# Patient Record
Sex: Female | Born: 1955
Health system: Southern US, Community
[De-identification: ages and names within clinical notes are randomized; demographics above are authoritative.]

## PROBLEM LIST (undated history)

## (undated) DIAGNOSIS — F32A Depression, unspecified: Secondary | ICD-10-CM

## (undated) DIAGNOSIS — F329 Major depressive disorder, single episode, unspecified: Secondary | ICD-10-CM

## (undated) DIAGNOSIS — M1990 Unspecified osteoarthritis, unspecified site: Secondary | ICD-10-CM

## (undated) DIAGNOSIS — I10 Essential (primary) hypertension: Secondary | ICD-10-CM

## (undated) DIAGNOSIS — F419 Anxiety disorder, unspecified: Secondary | ICD-10-CM

## (undated) DIAGNOSIS — L309 Dermatitis, unspecified: Secondary | ICD-10-CM

## (undated) HISTORY — PX: KNEE SURGERY: SHX244

## (undated) HISTORY — PX: FINGER GANGLION CYST EXCISION: SHX1636

## (undated) HISTORY — DX: Essential (primary) hypertension: I10

## (undated) HISTORY — DX: Major depressive disorder, single episode, unspecified: F32.9

## (undated) HISTORY — DX: Depression, unspecified: F32.A

## (undated) HISTORY — DX: Anxiety disorder, unspecified: F41.9

---

## 2004-10-15 ENCOUNTER — Emergency Department (HOSPITAL_COMMUNITY): Admission: EM | Admit: 2004-10-15 | Discharge: 2004-10-15 | Payer: Self-pay | Admitting: Emergency Medicine

## 2007-01-08 ENCOUNTER — Ambulatory Visit (HOSPITAL_COMMUNITY): Admission: RE | Admit: 2007-01-08 | Discharge: 2007-01-08 | Payer: Self-pay | Admitting: Family Medicine

## 2007-03-05 ENCOUNTER — Encounter: Admission: RE | Admit: 2007-03-05 | Discharge: 2007-03-05 | Payer: Self-pay | Admitting: Obstetrics and Gynecology

## 2007-05-19 ENCOUNTER — Other Ambulatory Visit: Admission: RE | Admit: 2007-05-19 | Discharge: 2007-05-19 | Payer: Self-pay | Admitting: Obstetrics and Gynecology

## 2007-07-09 ENCOUNTER — Ambulatory Visit (HOSPITAL_COMMUNITY): Admission: RE | Admit: 2007-07-09 | Discharge: 2007-07-09 | Payer: Self-pay | Admitting: Family Medicine

## 2007-07-13 ENCOUNTER — Ambulatory Visit (HOSPITAL_COMMUNITY): Admission: RE | Admit: 2007-07-13 | Discharge: 2007-07-13 | Payer: Self-pay | Admitting: Family Medicine

## 2008-03-09 ENCOUNTER — Encounter: Admission: RE | Admit: 2008-03-09 | Discharge: 2008-03-09 | Payer: Self-pay | Admitting: Obstetrics and Gynecology

## 2008-03-17 ENCOUNTER — Encounter: Admission: RE | Admit: 2008-03-17 | Discharge: 2008-03-17 | Payer: Self-pay | Admitting: Obstetrics and Gynecology

## 2008-06-12 ENCOUNTER — Other Ambulatory Visit: Admission: RE | Admit: 2008-06-12 | Discharge: 2008-06-12 | Payer: Self-pay | Admitting: Obstetrics and Gynecology

## 2009-03-12 ENCOUNTER — Ambulatory Visit (HOSPITAL_COMMUNITY): Admission: RE | Admit: 2009-03-12 | Discharge: 2009-03-12 | Payer: Self-pay | Admitting: Obstetrics and Gynecology

## 2010-03-15 ENCOUNTER — Ambulatory Visit (HOSPITAL_COMMUNITY): Admission: RE | Admit: 2010-03-15 | Discharge: 2010-03-15 | Payer: Self-pay | Admitting: Obstetrics and Gynecology

## 2010-06-23 ENCOUNTER — Encounter (INDEPENDENT_AMBULATORY_CARE_PROVIDER_SITE_OTHER): Payer: Self-pay | Admitting: Internal Medicine

## 2011-03-13 ENCOUNTER — Other Ambulatory Visit (HOSPITAL_COMMUNITY): Payer: Self-pay | Admitting: Family Medicine

## 2011-03-13 DIAGNOSIS — Z139 Encounter for screening, unspecified: Secondary | ICD-10-CM

## 2011-03-25 ENCOUNTER — Ambulatory Visit (HOSPITAL_COMMUNITY)
Admission: RE | Admit: 2011-03-25 | Discharge: 2011-03-25 | Disposition: A | Discharge status: Home / Self Care | Payer: BC Managed Care – PPO | Source: Ambulatory Visit | Attending: Family Medicine | Admitting: Family Medicine

## 2011-03-25 DIAGNOSIS — Z1231 Encounter for screening mammogram for malignant neoplasm of breast: Secondary | ICD-10-CM | POA: Insufficient documentation

## 2011-03-25 DIAGNOSIS — Z139 Encounter for screening, unspecified: Secondary | ICD-10-CM

## 2011-06-25 ENCOUNTER — Other Ambulatory Visit (HOSPITAL_COMMUNITY): Payer: Self-pay | Admitting: Family Medicine

## 2011-06-25 ENCOUNTER — Ambulatory Visit (HOSPITAL_COMMUNITY)
Admission: RE | Admit: 2011-06-25 | Discharge: 2011-06-25 | Disposition: A | Discharge status: Home / Self Care | Payer: BC Managed Care – PPO | Source: Ambulatory Visit | Attending: Family Medicine | Admitting: Family Medicine

## 2011-06-25 DIAGNOSIS — M19019 Primary osteoarthritis, unspecified shoulder: Secondary | ICD-10-CM | POA: Insufficient documentation

## 2011-06-25 DIAGNOSIS — M1990 Unspecified osteoarthritis, unspecified site: Secondary | ICD-10-CM

## 2011-06-25 DIAGNOSIS — M25519 Pain in unspecified shoulder: Secondary | ICD-10-CM | POA: Insufficient documentation

## 2012-03-17 ENCOUNTER — Other Ambulatory Visit (HOSPITAL_COMMUNITY): Payer: Self-pay | Admitting: Family Medicine

## 2012-03-17 DIAGNOSIS — Z139 Encounter for screening, unspecified: Secondary | ICD-10-CM

## 2012-03-26 ENCOUNTER — Ambulatory Visit (HOSPITAL_COMMUNITY)
Admission: RE | Admit: 2012-03-26 | Discharge: 2012-03-26 | Disposition: A | Discharge status: Home / Self Care | Payer: BC Managed Care – PPO | Source: Ambulatory Visit | Attending: Family Medicine | Admitting: Family Medicine

## 2012-03-26 DIAGNOSIS — Z1231 Encounter for screening mammogram for malignant neoplasm of breast: Secondary | ICD-10-CM | POA: Insufficient documentation

## 2012-03-26 DIAGNOSIS — Z139 Encounter for screening, unspecified: Secondary | ICD-10-CM

## 2012-08-30 ENCOUNTER — Encounter (INDEPENDENT_AMBULATORY_CARE_PROVIDER_SITE_OTHER): Payer: Self-pay | Admitting: *Deleted

## 2012-08-30 ENCOUNTER — Encounter (INDEPENDENT_AMBULATORY_CARE_PROVIDER_SITE_OTHER): Payer: Self-pay

## 2012-09-02 ENCOUNTER — Other Ambulatory Visit (INDEPENDENT_AMBULATORY_CARE_PROVIDER_SITE_OTHER): Payer: Self-pay | Admitting: *Deleted

## 2012-09-02 ENCOUNTER — Telehealth (INDEPENDENT_AMBULATORY_CARE_PROVIDER_SITE_OTHER): Payer: Self-pay | Admitting: *Deleted

## 2012-09-02 DIAGNOSIS — Z800 Family history of malignant neoplasm of digestive organs: Secondary | ICD-10-CM

## 2012-09-02 DIAGNOSIS — Z86010 Personal history of colonic polyps: Secondary | ICD-10-CM

## 2012-09-02 DIAGNOSIS — Z1211 Encounter for screening for malignant neoplasm of colon: Secondary | ICD-10-CM

## 2012-09-02 NOTE — Telephone Encounter (Signed)
Patient needs movi prep 

## 2012-09-03 ENCOUNTER — Encounter (HOSPITAL_COMMUNITY): Payer: Self-pay | Admitting: Pharmacy Technician

## 2012-09-06 MED ORDER — PEG-KCL-NACL-NASULF-NA ASC-C 100 G PO SOLR: 1.0000 | Freq: Once | ORAL | Status: DC

## 2012-09-21 ENCOUNTER — Telehealth (INDEPENDENT_AMBULATORY_CARE_PROVIDER_SITE_OTHER): Payer: Self-pay | Admitting: *Deleted

## 2012-09-21 NOTE — Telephone Encounter (Signed)
  Procedure: tcs  Reason/Indication:  Hx polyps, fa, hx colon ca  Has patient had this procedure before?  Yes, 4/09 (scanned)  If so, when, by whom and where?    Is there a family history of colon cancer?  Yes, grandmother  Who?  What age when diagnosed?    Is patient diabetic?   no      Does patient have prosthetic heart valve?  no  Do you have a pacemaker?  no  Has patient ever had endocarditis? no  Has patient had joint replacement within last 12 months?  no  Is patient on Coumadin, Plavix and/or Aspirin? no  Medications: naproxen 500 mg 1 tab bid  Allergies: nkda  Medication Adjustment:   Procedure date & time: 10/20/12 at 930

## 2012-09-21 NOTE — Telephone Encounter (Signed)
agree

## 2012-10-20 ENCOUNTER — Encounter (HOSPITAL_COMMUNITY): Payer: Self-pay | Admitting: *Deleted

## 2012-10-20 ENCOUNTER — Encounter (HOSPITAL_COMMUNITY)
Admission: RE | Disposition: A | Discharge status: Home / Self Care | Payer: Self-pay | Source: Ambulatory Visit | Attending: Internal Medicine

## 2012-10-20 ENCOUNTER — Ambulatory Visit (HOSPITAL_COMMUNITY)
Admission: RE | Admit: 2012-10-20 | Discharge: 2012-10-20 | Disposition: A | Discharge status: Home / Self Care | Payer: BC Managed Care – PPO | Source: Ambulatory Visit | Attending: Internal Medicine | Admitting: Internal Medicine

## 2012-10-20 DIAGNOSIS — Z800 Family history of malignant neoplasm of digestive organs: Secondary | ICD-10-CM

## 2012-10-20 DIAGNOSIS — Z860100 Personal history of colon polyps, unspecified: Secondary | ICD-10-CM | POA: Insufficient documentation

## 2012-10-20 DIAGNOSIS — K5730 Diverticulosis of large intestine without perforation or abscess without bleeding: Secondary | ICD-10-CM | POA: Insufficient documentation

## 2012-10-20 DIAGNOSIS — K6389 Other specified diseases of intestine: Secondary | ICD-10-CM

## 2012-10-20 DIAGNOSIS — K5900 Constipation, unspecified: Secondary | ICD-10-CM

## 2012-10-20 HISTORY — DX: Unspecified osteoarthritis, unspecified site: M19.90

## 2012-10-20 HISTORY — DX: Dermatitis, unspecified: L30.9

## 2012-10-20 HISTORY — PX: COLONOSCOPY: SHX5424

## 2012-10-20 SURGERY — COLONOSCOPY
Anesthesia: Moderate Sedation

## 2012-10-20 MED ORDER — MIDAZOLAM HCL 5 MG/5ML IJ SOLN
INTRAMUSCULAR | Status: DC | PRN
  Administered 2012-10-20: 1 mg via INTRAVENOUS
  Administered 2012-10-20 (×3): 2 mg via INTRAVENOUS

## 2012-10-20 MED ORDER — SODIUM CHLORIDE 0.9 % IV SOLN
INTRAVENOUS | Status: DC
  Administered 2012-10-20: 20 mL/h via INTRAVENOUS

## 2012-10-20 MED ORDER — MIDAZOLAM HCL 5 MG/5ML IJ SOLN
INTRAMUSCULAR | Status: AC
  Filled 2012-10-20: qty 10

## 2012-10-20 MED ORDER — MEPERIDINE HCL 50 MG/ML IJ SOLN
INTRAMUSCULAR | Status: AC
  Filled 2012-10-20: qty 1

## 2012-10-20 MED ORDER — MEPERIDINE HCL 50 MG/ML IJ SOLN
INTRAMUSCULAR | Status: DC | PRN
  Administered 2012-10-20 (×2): 25 mg via INTRAVENOUS

## 2012-10-20 MED ORDER — STERILE WATER FOR IRRIGATION IR SOLN
Status: DC | PRN
  Administered 2012-10-20: 09:00:00

## 2012-10-20 NOTE — H&P (Signed)
Megan Schaefer is an 57 y.o. female.   Chief Complaint: Patient's here for colonoscopy. HPI: Patient is 57 year old female who is for surveillance colonoscopy. Her last exam was 5 years ago with removal of 2 small polyps and one was tubular adenoma. Family history significant for colon carcinoma in paternal grandfather and his 64s and mother also colon carcinoma and died at 59. She denies abdominal pain change in her bowel habits or rectal bleeding. Past Medical History  Diagnosis Date  . Eczema   . Arthritis     Past Surgical History  Procedure Laterality Date  . Finger ganglion cyst excision Right     pinky    History reviewed. No pertinent family history. Social History:  reports that she has never smoked. She does not have any smokeless tobacco history on file. She reports that she drinks about 0.6 ounces of alcohol per week. She reports that she does not use illicit drugs.  Allergies: No Known Allergies  Medications Prior to Admission  Medication Sig Dispense Refill  . naproxen (NAPROSYN) 500 MG tablet Take 500 mg by mouth 2 (two) times daily.      . peg 3350 powder (MOVIPREP) 100 G SOLR Take 1 kit (100 g total) by mouth once.  1 kit  0    No results found for this or any previous visit (from the past 48 hour(s)). No results found.  ROS  Blood pressure 129/88, pulse 84, temperature 97.8 F (36.6 C), temperature source Oral, resp. rate 16, height 5\' 4"  (1.626 m), weight 162 lb (73.483 kg), SpO2 100.00%. Physical Exam  Constitutional: She appears well-developed and well-nourished.  HENT:  Mouth/Throat: Oropharynx is clear and moist.  Eyes: Conjunctivae are normal. No scleral icterus.  Neck: No thyromegaly present.  Cardiovascular: Normal rate, regular rhythm and normal heart sounds.   No murmur heard. Respiratory: Effort normal and breath sounds normal.  GI: Soft. She exhibits no distension and no mass. There is no tenderness.  Musculoskeletal: She exhibits no edema.   Lymphadenopathy:    She has no cervical adenopathy.  Neurological: She is alert.  Skin: Skin is warm and dry.     Assessment/Plan History of colonic adenoma. Family history of colon carcinoma. Surveillance colonoscopy.  Tifini Reeder U 10/20/2012, 9:09 AM

## 2012-10-20 NOTE — Op Note (Signed)
COLONOSCOPY PROCEDURE REPORT  PATIENT:  Megan Schaefer  MR#:  [id number] Birthdate:  26-Sep-1955, 57 y.o., female Endoscopist:  Dr. Malissa Hippo, MD Referred By:  Dr. Kirk Ruths, MD Procedure Date: 10/20/2012  Procedure:   Colonoscopy  Indications:  Patient is 57 year old African female with history of colonic adenoma and family history of colon carcinoma mother as well as paternal grandfather. Patient's last colonoscopy was 5 years ago. Patient has chronic constipation and uses OTC laxative that she has done for several years.  Informed Consent:  The procedure and risks were reviewed with the patient and informed consent was obtained.  Medications:  Demerol 50 mg IV Versed  7 mg IV  Description of procedure:  After a digital rectal exam was performed, that colonoscope was advanced from the anus through the rectum and colon to the area of the cecum, ileocecal valve and appendiceal orifice. The cecum was deeply intubated. These structures were well-seen and photographed for the record. From the level of the cecum and ileocecal valve, the scope was slowly and cautiously withdrawn. The mucosal surfaces were carefully surveyed utilizing scope tip to flexion to facilitate fold flattening as needed. The scope was pulled down into the rectum where a thorough exam including retroflexion was performed.  Findings:   Prep excellent. Redundant colon with diffuse mucosal pigmentation all the way from rectum to cecum. Short, mobile structure at appendiceal orifice either felt to be inverted appendix or hyperplastic polyp. Unchanged from last exam and was biopsied and was not an adenoma. Few small diverticula in sigmoid and descending colon. Normal rectal mucosa and anal rectal junction.    Therapeutic/Diagnostic Maneuvers Performed:  None  Complications:  None  Cecal Withdrawal Time:  10 minutes  Impression:  Examination performed to cecum. Short tubular structure at appendiceal  orifice unchanged from last exam 5 years ago. It is either a inverted appendix or small hyperplastic polyp. It was biopsied 5 years ago and was not an adenoma. Melanosis coli. Few diverticula and sigmoid and descending colon.  Recommendations:  Standard instructions given. High fiber diet and Metamucil 4 g by mouth daily. Next colonoscopy in 5 years. Pooja Camuso U  10/20/2012 10:06 AM  CC: Dr. Kirk Ruths, MD & Dr. Bonnetta Barry ref. provider found

## 2012-10-22 ENCOUNTER — Encounter (HOSPITAL_COMMUNITY): Payer: Self-pay | Admitting: Internal Medicine

## 2013-02-22 ENCOUNTER — Ambulatory Visit (INDEPENDENT_AMBULATORY_CARE_PROVIDER_SITE_OTHER): Payer: BC Managed Care – PPO | Admitting: Internal Medicine

## 2013-02-25 ENCOUNTER — Other Ambulatory Visit (HOSPITAL_COMMUNITY): Payer: Self-pay | Admitting: Obstetrics and Gynecology

## 2013-02-25 DIAGNOSIS — Z139 Encounter for screening, unspecified: Secondary | ICD-10-CM

## 2013-03-28 ENCOUNTER — Ambulatory Visit (HOSPITAL_COMMUNITY)
Admission: RE | Admit: 2013-03-28 | Discharge: 2013-03-28 | Disposition: A | Discharge status: Home / Self Care | Payer: BC Managed Care – PPO | Source: Ambulatory Visit | Attending: Obstetrics and Gynecology | Admitting: Obstetrics and Gynecology

## 2013-03-28 DIAGNOSIS — Z139 Encounter for screening, unspecified: Secondary | ICD-10-CM

## 2013-03-28 DIAGNOSIS — Z1231 Encounter for screening mammogram for malignant neoplasm of breast: Secondary | ICD-10-CM | POA: Insufficient documentation

## 2013-06-15 ENCOUNTER — Other Ambulatory Visit (HOSPITAL_COMMUNITY): Payer: Self-pay | Admitting: Family Medicine

## 2013-06-15 DIAGNOSIS — IMO0002 Reserved for concepts with insufficient information to code with codable children: Secondary | ICD-10-CM

## 2013-06-17 ENCOUNTER — Ambulatory Visit (HOSPITAL_COMMUNITY)
Admission: RE | Admit: 2013-06-17 | Discharge: 2013-06-17 | Disposition: A | Discharge status: Home / Self Care | Payer: BC Managed Care – PPO | Source: Ambulatory Visit | Attending: Family Medicine | Admitting: Family Medicine

## 2013-06-17 ENCOUNTER — Ambulatory Visit (HOSPITAL_COMMUNITY): Admission: RE | Admit: 2013-06-17 | Payer: BC Managed Care – PPO | Source: Ambulatory Visit

## 2013-06-17 DIAGNOSIS — M47817 Spondylosis without myelopathy or radiculopathy, lumbosacral region: Secondary | ICD-10-CM | POA: Insufficient documentation

## 2013-06-17 DIAGNOSIS — M48061 Spinal stenosis, lumbar region without neurogenic claudication: Secondary | ICD-10-CM | POA: Insufficient documentation

## 2013-06-17 DIAGNOSIS — M545 Low back pain, unspecified: Secondary | ICD-10-CM | POA: Insufficient documentation

## 2013-06-17 DIAGNOSIS — M51379 Other intervertebral disc degeneration, lumbosacral region without mention of lumbar back pain or lower extremity pain: Secondary | ICD-10-CM | POA: Insufficient documentation

## 2013-06-17 DIAGNOSIS — IMO0002 Reserved for concepts with insufficient information to code with codable children: Secondary | ICD-10-CM

## 2013-06-17 DIAGNOSIS — R209 Unspecified disturbances of skin sensation: Secondary | ICD-10-CM | POA: Insufficient documentation

## 2013-06-17 DIAGNOSIS — M5137 Other intervertebral disc degeneration, lumbosacral region: Secondary | ICD-10-CM | POA: Insufficient documentation

## 2013-06-17 DIAGNOSIS — M5126 Other intervertebral disc displacement, lumbar region: Secondary | ICD-10-CM | POA: Insufficient documentation

## 2013-06-20 ENCOUNTER — Ambulatory Visit (HOSPITAL_COMMUNITY): Admission: RE | Admit: 2013-06-20 | Payer: BC Managed Care – PPO | Source: Ambulatory Visit

## 2014-03-03 ENCOUNTER — Other Ambulatory Visit (HOSPITAL_COMMUNITY): Payer: Self-pay | Admitting: Obstetrics and Gynecology

## 2014-03-03 DIAGNOSIS — Z1231 Encounter for screening mammogram for malignant neoplasm of breast: Secondary | ICD-10-CM

## 2014-03-29 ENCOUNTER — Ambulatory Visit (HOSPITAL_COMMUNITY)
Admission: RE | Admit: 2014-03-29 | Discharge: 2014-03-29 | Disposition: A | Discharge status: Home / Self Care | Payer: BC Managed Care – PPO | Source: Ambulatory Visit | Attending: Obstetrics and Gynecology | Admitting: Obstetrics and Gynecology

## 2014-03-29 DIAGNOSIS — Z1231 Encounter for screening mammogram for malignant neoplasm of breast: Secondary | ICD-10-CM

## 2014-03-29 DIAGNOSIS — R928 Other abnormal and inconclusive findings on diagnostic imaging of breast: Secondary | ICD-10-CM | POA: Diagnosis not present

## 2014-03-31 ENCOUNTER — Other Ambulatory Visit: Payer: Self-pay | Admitting: Obstetrics and Gynecology

## 2014-03-31 DIAGNOSIS — R928 Other abnormal and inconclusive findings on diagnostic imaging of breast: Secondary | ICD-10-CM

## 2014-04-18 ENCOUNTER — Ambulatory Visit (HOSPITAL_COMMUNITY)
Admission: RE | Admit: 2014-04-18 | Discharge: 2014-04-18 | Disposition: A | Discharge status: Home / Self Care | Payer: BC Managed Care – PPO | Source: Ambulatory Visit | Attending: Obstetrics and Gynecology | Admitting: Obstetrics and Gynecology

## 2014-04-18 DIAGNOSIS — R928 Other abnormal and inconclusive findings on diagnostic imaging of breast: Secondary | ICD-10-CM | POA: Insufficient documentation

## 2015-03-14 ENCOUNTER — Other Ambulatory Visit (HOSPITAL_COMMUNITY): Payer: Self-pay | Admitting: Obstetrics and Gynecology

## 2015-03-14 DIAGNOSIS — Z1231 Encounter for screening mammogram for malignant neoplasm of breast: Secondary | ICD-10-CM

## 2015-04-02 ENCOUNTER — Ambulatory Visit (HOSPITAL_COMMUNITY): Payer: Self-pay

## 2015-04-04 ENCOUNTER — Ambulatory Visit (HOSPITAL_COMMUNITY)
Admission: RE | Admit: 2015-04-04 | Discharge: 2015-04-04 | Disposition: A | Discharge status: Home / Self Care | Payer: BLUE CROSS/BLUE SHIELD | Source: Ambulatory Visit | Attending: Obstetrics and Gynecology | Admitting: Obstetrics and Gynecology

## 2015-04-04 DIAGNOSIS — Z1231 Encounter for screening mammogram for malignant neoplasm of breast: Secondary | ICD-10-CM | POA: Diagnosis present

## 2015-11-05 DIAGNOSIS — Z6830 Body mass index (BMI) 30.0-30.9, adult: Secondary | ICD-10-CM | POA: Diagnosis not present

## 2015-11-05 DIAGNOSIS — B351 Tinea unguium: Secondary | ICD-10-CM | POA: Diagnosis not present

## 2015-11-05 DIAGNOSIS — Z1389 Encounter for screening for other disorder: Secondary | ICD-10-CM | POA: Diagnosis not present

## 2015-11-05 DIAGNOSIS — Z720 Tobacco use: Secondary | ICD-10-CM | POA: Diagnosis not present

## 2015-11-05 DIAGNOSIS — I10 Essential (primary) hypertension: Secondary | ICD-10-CM | POA: Diagnosis not present

## 2016-02-18 DIAGNOSIS — Z23 Encounter for immunization: Secondary | ICD-10-CM | POA: Diagnosis not present

## 2016-03-11 ENCOUNTER — Other Ambulatory Visit (HOSPITAL_COMMUNITY): Payer: Self-pay | Admitting: Obstetrics and Gynecology

## 2016-03-11 DIAGNOSIS — Z1231 Encounter for screening mammogram for malignant neoplasm of breast: Secondary | ICD-10-CM

## 2016-04-07 ENCOUNTER — Ambulatory Visit (HOSPITAL_COMMUNITY)
Admission: RE | Admit: 2016-04-07 | Discharge: 2016-04-07 | Disposition: A | Discharge status: Home / Self Care | Payer: BLUE CROSS/BLUE SHIELD | Source: Ambulatory Visit | Attending: Obstetrics and Gynecology | Admitting: Obstetrics and Gynecology

## 2016-04-07 DIAGNOSIS — Z1231 Encounter for screening mammogram for malignant neoplasm of breast: Secondary | ICD-10-CM | POA: Diagnosis not present

## 2016-05-20 DIAGNOSIS — Z6829 Body mass index (BMI) 29.0-29.9, adult: Secondary | ICD-10-CM | POA: Diagnosis not present

## 2016-05-20 DIAGNOSIS — R070 Pain in throat: Secondary | ICD-10-CM | POA: Diagnosis not present

## 2016-05-20 DIAGNOSIS — Z1389 Encounter for screening for other disorder: Secondary | ICD-10-CM | POA: Diagnosis not present

## 2016-05-20 DIAGNOSIS — L309 Dermatitis, unspecified: Secondary | ICD-10-CM | POA: Diagnosis not present

## 2016-06-03 DIAGNOSIS — Z6826 Body mass index (BMI) 26.0-26.9, adult: Secondary | ICD-10-CM | POA: Diagnosis not present

## 2016-06-03 DIAGNOSIS — R070 Pain in throat: Secondary | ICD-10-CM | POA: Diagnosis not present

## 2016-06-03 DIAGNOSIS — I10 Essential (primary) hypertension: Secondary | ICD-10-CM | POA: Diagnosis not present

## 2016-06-25 ENCOUNTER — Other Ambulatory Visit (HOSPITAL_COMMUNITY): Payer: Self-pay | Admitting: Internal Medicine

## 2016-06-25 DIAGNOSIS — F330 Major depressive disorder, recurrent, mild: Secondary | ICD-10-CM | POA: Diagnosis not present

## 2016-06-25 DIAGNOSIS — I10 Essential (primary) hypertension: Secondary | ICD-10-CM | POA: Diagnosis not present

## 2016-06-25 DIAGNOSIS — Z0001 Encounter for general adult medical examination with abnormal findings: Secondary | ICD-10-CM | POA: Diagnosis not present

## 2016-06-25 DIAGNOSIS — Z780 Asymptomatic menopausal state: Secondary | ICD-10-CM

## 2016-07-02 ENCOUNTER — Encounter (HOSPITAL_COMMUNITY): Payer: Self-pay

## 2016-07-02 ENCOUNTER — Other Ambulatory Visit (HOSPITAL_COMMUNITY): Payer: BLUE CROSS/BLUE SHIELD

## 2016-07-28 DIAGNOSIS — Z6827 Body mass index (BMI) 27.0-27.9, adult: Secondary | ICD-10-CM | POA: Diagnosis not present

## 2016-07-28 DIAGNOSIS — F330 Major depressive disorder, recurrent, mild: Secondary | ICD-10-CM | POA: Diagnosis not present

## 2016-07-28 DIAGNOSIS — I10 Essential (primary) hypertension: Secondary | ICD-10-CM | POA: Diagnosis not present

## 2016-09-30 DIAGNOSIS — M5416 Radiculopathy, lumbar region: Secondary | ICD-10-CM | POA: Diagnosis not present

## 2016-09-30 DIAGNOSIS — M5410 Radiculopathy, site unspecified: Secondary | ICD-10-CM | POA: Diagnosis not present

## 2016-10-15 DIAGNOSIS — F3489 Other specified persistent mood disorders: Secondary | ICD-10-CM | POA: Diagnosis not present

## 2016-10-15 DIAGNOSIS — B351 Tinea unguium: Secondary | ICD-10-CM | POA: Diagnosis not present

## 2016-10-15 DIAGNOSIS — F330 Major depressive disorder, recurrent, mild: Secondary | ICD-10-CM | POA: Diagnosis not present

## 2016-10-21 DIAGNOSIS — M5416 Radiculopathy, lumbar region: Secondary | ICD-10-CM | POA: Diagnosis not present

## 2016-10-21 DIAGNOSIS — M5410 Radiculopathy, site unspecified: Secondary | ICD-10-CM | POA: Diagnosis not present

## 2016-10-30 DIAGNOSIS — Z6827 Body mass index (BMI) 27.0-27.9, adult: Secondary | ICD-10-CM | POA: Diagnosis not present

## 2016-10-30 DIAGNOSIS — M5410 Radiculopathy, site unspecified: Secondary | ICD-10-CM | POA: Diagnosis not present

## 2016-11-17 DIAGNOSIS — M79674 Pain in right toe(s): Secondary | ICD-10-CM | POA: Diagnosis not present

## 2016-11-17 DIAGNOSIS — B351 Tinea unguium: Secondary | ICD-10-CM | POA: Diagnosis not present

## 2017-03-24 ENCOUNTER — Other Ambulatory Visit (HOSPITAL_COMMUNITY): Payer: Self-pay | Admitting: Obstetrics and Gynecology

## 2017-03-24 DIAGNOSIS — Z1231 Encounter for screening mammogram for malignant neoplasm of breast: Secondary | ICD-10-CM

## 2017-04-09 ENCOUNTER — Encounter (HOSPITAL_COMMUNITY): Payer: Self-pay

## 2017-04-09 ENCOUNTER — Ambulatory Visit (HOSPITAL_COMMUNITY)
Admission: RE | Admit: 2017-04-09 | Discharge: 2017-04-09 | Disposition: A | Discharge status: Home / Self Care | Payer: BLUE CROSS/BLUE SHIELD | Source: Ambulatory Visit | Attending: Obstetrics and Gynecology | Admitting: Obstetrics and Gynecology

## 2017-04-09 DIAGNOSIS — Z1231 Encounter for screening mammogram for malignant neoplasm of breast: Secondary | ICD-10-CM | POA: Diagnosis not present

## 2017-07-03 ENCOUNTER — Ambulatory Visit: Payer: BLUE CROSS/BLUE SHIELD | Admitting: Family Medicine

## 2017-07-03 ENCOUNTER — Encounter: Payer: Self-pay | Admitting: Family Medicine

## 2017-07-03 ENCOUNTER — Other Ambulatory Visit: Payer: Self-pay

## 2017-07-03 DIAGNOSIS — F4010 Social phobia, unspecified: Secondary | ICD-10-CM | POA: Diagnosis not present

## 2017-07-03 DIAGNOSIS — I10 Essential (primary) hypertension: Secondary | ICD-10-CM

## 2017-07-03 DIAGNOSIS — D369 Benign neoplasm, unspecified site: Secondary | ICD-10-CM

## 2017-07-03 DIAGNOSIS — Z720 Tobacco use: Secondary | ICD-10-CM

## 2017-07-03 MED ORDER — ESCITALOPRAM OXALATE 10 MG PO TABS: ORAL_TABLET | ORAL | 1 refills | Status: DC

## 2017-07-03 MED ORDER — LISINOPRIL-HYDROCHLOROTHIAZIDE 20-25 MG PO TABS: 1.0000 | ORAL_TABLET | Freq: Every day | ORAL | 3 refills | Status: AC

## 2017-07-03 NOTE — Progress Notes (Signed)
Chief Complaint  Patient presents with  . Hypertension  . Establish Care   This is a new patient to establish care. She is out of her blood pressure medication and needs this refilled.  She states her blood pressure has been well controlled on lisinopril hydrochlorothiazide.  She is taken this for years. She also states she previously took Lexapro.  She feels that this was helpful for her.  She has a social anxiety disorder and does not leave the house much without it. She is changing primary care because she was discharged from her last practice.  She states she was discharged for "behavior".  She states that there was an "outburst" in the office manager asked her not to come back.  I will request her old records to get more details.  Patient feels like this was an isolated incident, and that she was having "a bad day". She states that she eats well.  She exercises daily.  She lives alone.  She does not have very much social contact.  She denies friends, family, church activities.  Her parents have both died.  She had no siblings.  She has no children.  We discussed that for her mental health she needs to increase social contacts.  She should start by going to the animal shelter and adopting a pet.  She states when she was younger she always had dogs. She feels her medical care is up-to-date.  She sees her GYN yearly.  She has her mammograms yearly.  She had a colonoscopy in 2014, and is due in 5 years (this may).  She states her immunizations are up-to-date, flu, pneumonia, and shingles.  I will request her old records.   Patient Active Problem List   Diagnosis Date Noted  . Hypertension 07/03/2017  . Social anxiety disorder 07/03/2017  . Tobacco abuse 07/03/2017  . Tubular adenoma 07/03/2017    Outpatient Encounter Medications as of 07/03/2017  Medication Sig  . escitalopram (LEXAPRO) 10 MG tablet Take one a day for one week then take 2 daily until return  .  lisinopril-hydrochlorothiazide (PRINZIDE,ZESTORETIC) 20-25 MG tablet Take 1 tablet by mouth daily.  . naproxen (NAPROSYN) 500 MG tablet Take 500 mg by mouth 2 (two) times daily.   No facility-administered encounter medications on file as of 07/03/2017.     Past Medical History:  Diagnosis Date  . Anxiety   . Arthritis   . Depression   . Eczema   . Hypertension     Past Surgical History:  Procedure Laterality Date  . COLONOSCOPY N/A 10/20/2012   Procedure: COLONOSCOPY;  Surgeon: [name], MD;  Location: [facility];  Service: Endoscopy;  Laterality: N/A;  930  . FINGER GANGLION CYST EXCISION Right    pinky    Social History   Socioeconomic History  . Marital status: Single    Spouse name: Not on file  . Number of children: Not on file  . Years of education: 41  . Highest education level: Master's degree (e.g., MA, MS, MEng, MEd, MSW, MBA)  Social Needs  . Financial resource strain: Not hard at all  . Food insecurity - worry: Never true  . Food insecurity - inability: Never true  . Transportation needs - medical: No  . Transportation needs - non-medical: No  Occupational History  . Occupation: unemployed    Comment: Education administrator  Tobacco Use  . Smoking status: Current Every Day Smoker    Packs/day: 0.25  Years: 25.00    Pack years: 6.25  . Smokeless tobacco: Never Used  Substance and Sexual Activity  . Alcohol use: Yes    Alcohol/week: 6.0 oz    Types: 10 Shots of liquor per week    Comment: 1-2 a day  . Drug use: No  . Sexual activity: No  Other Topics Concern  . Not on file  Social History Narrative   Not currently working   [place] in 2006   Worked Mudlogger at [employer].   Lives alone   No pets   Walks daily    Family History  Problem Relation Age of Onset  . Hearing loss Mother   . Hyperlipidemia Mother   . Liver disease Mother        died at 61  . Arthritis Father   . COPD Father   . Heart disease Father   .  Hypertension Father   . Alzheimer's disease Maternal Aunt     Review of Systems  Constitutional: Negative for chills, fever and weight loss.  HENT: Negative for congestion and hearing loss.        Regular dental care  Eyes: Negative for blurred vision and pain.       Due for eye exam  Respiratory: Negative for cough and shortness of breath.   Cardiovascular: Negative for chest pain and leg swelling.  Gastrointestinal: Negative for abdominal pain, constipation, diarrhea and heartburn.  Genitourinary: Negative for dysuria and frequency.  Musculoskeletal: Negative for falls, joint pain and myalgias.  Neurological: Negative for dizziness, seizures and headaches.  Psychiatric/Behavioral: Positive for depression. The patient is nervous/anxious. The patient does not have insomnia.        Is isolating herself.  Not engaging in social activities.  Not able to complete tasks she sets up for herself.    BP (!) 146/82 (BP Location: Left Arm, Patient Position: Sitting, Cuff Size: Normal)   Pulse 82   Temp 98.8 F (37.1 C) (Temporal)   Resp 16   Ht 5\' 4"  (1.626 m)   Wt 176 lb 4 oz (79.9 kg)   SpO2 96%   BMI 30.25 kg/m   Physical Exam  Constitutional: She is oriented to person, place, and time. She appears well-developed and well-nourished.  HENT:  Head: Normocephalic and atraumatic.  Mouth/Throat: Oropharynx is clear and moist.  Teeth well repaired  Eyes: EOM are normal. Pupils are equal, round, and reactive to light.  Glasses  Neck: Normal range of motion.  Cardiovascular: Normal rate, regular rhythm and normal heart sounds.  Pulmonary/Chest: Effort normal and breath sounds normal.  Musculoskeletal:  Normal gait and movements  Neurological: She is alert and oriented to person, place, and time.  Psychiatric: She has a normal mood and affect. Her behavior is normal. Thought content normal.   ASSESSMENT/PLAN:    1. Essential hypertension Controlled by history.  Out of medicine  2.  Social anxiety disorder Currently untreated  3. Tobacco abuse Discussed cessation.  Patient wants to try on her own and then let me prescribe something if she is not successful by next visit  4. Tubular adenoma By history.  Due for colonoscopy May 2019   Patient Instructions  Start lexapro Need old records Come back in a month for a PE Consider reducing/quitting the cigarettes   Raylene Everts, MD

## 2017-07-03 NOTE — Patient Instructions (Signed)
Start lexapro Need old records Come back in a month for a PE Consider reducing/quitting the cigarettes

## 2017-07-31 ENCOUNTER — Ambulatory Visit: Payer: BLUE CROSS/BLUE SHIELD | Admitting: Family Medicine

## 2017-08-06 ENCOUNTER — Encounter: Payer: Self-pay | Admitting: Family Medicine

## 2017-08-06 ENCOUNTER — Ambulatory Visit: Payer: BLUE CROSS/BLUE SHIELD | Admitting: Family Medicine

## 2017-08-06 VITALS — BP 124/82 | HR 81 | Temp 97.5°F | Ht 64.0 in | Wt 179.2 lb

## 2017-08-06 DIAGNOSIS — Z114 Encounter for screening for human immunodeficiency virus [HIV]: Secondary | ICD-10-CM | POA: Diagnosis not present

## 2017-08-06 DIAGNOSIS — G4700 Insomnia, unspecified: Secondary | ICD-10-CM

## 2017-08-06 DIAGNOSIS — Z1159 Encounter for screening for other viral diseases: Secondary | ICD-10-CM

## 2017-08-06 DIAGNOSIS — I10 Essential (primary) hypertension: Secondary | ICD-10-CM

## 2017-08-06 DIAGNOSIS — F4010 Social phobia, unspecified: Secondary | ICD-10-CM

## 2017-08-06 MED ORDER — ESCITALOPRAM OXALATE 20 MG PO TABS: ORAL_TABLET | ORAL | 1 refills | Status: DC

## 2017-08-06 NOTE — Progress Notes (Signed)
Chief Complaint  Patient presents with  . Follow-up    not sleeping well, should she be taking aspirin and when to take lexapro     Routine follow up I do not yet have her old records She is feeling better Still with problems with anxiety and sleeping at night Goes to be and lays awake for hours Exercising but not regular Worried about weight Still smoking Has stopped all alcohol Is working towards a healthier lifestyle  Patient Active Problem List   Diagnosis Date Noted  . Hypertension 07/03/2017  . Social anxiety disorder 07/03/2017  . Tobacco abuse 07/03/2017  . Tubular adenoma 07/03/2017    Outpatient Encounter Medications as of 08/06/2017  Medication Sig  . escitalopram (LEXAPRO) 20 MG tablet Take one a day evening  . lisinopril-hydrochlorothiazide (PRINZIDE,ZESTORETIC) 20-25 MG tablet Take 1 tablet by mouth daily.  . naproxen (NAPROSYN) 500 MG tablet Take 500 mg by mouth 2 (two) times daily.   No facility-administered encounter medications on file as of 08/06/2017.     No Known Allergies  Review of Systems  Constitutional: Negative for activity change, appetite change and unexpected weight change.  HENT: Negative for congestion, dental problem, postnasal drip and rhinorrhea.   Eyes: Negative for redness and visual disturbance.  Respiratory: Negative for cough and shortness of breath.   Cardiovascular: Negative for chest pain, palpitations and leg swelling.  Gastrointestinal: Negative for abdominal pain, constipation and diarrhea.  Genitourinary: Negative for difficulty urinating, frequency and vaginal bleeding.  Musculoskeletal: Negative for arthralgias and back pain.  Neurological: Negative for dizziness and headaches.  Psychiatric/Behavioral: Positive for sleep disturbance. Negative for dysphoric mood. The patient is nervous/anxious.     BP 124/82 (BP Location: Left Arm, Patient Position: Sitting, Cuff Size: Normal)   Pulse 81   Temp (!) 97.5 F (36.4 C)  (Temporal)   Ht 5\' 4"  (1.626 m)   Wt 179 lb 4 oz (81.3 kg)   SpO2 100%   BMI 30.77 kg/m   Physical Exam  Constitutional: She is oriented to person, place, and time. She appears well-developed and well-nourished.  HENT:  Head: Normocephalic and atraumatic.  Mouth/Throat: Oropharynx is clear and moist.  Eyes: Conjunctivae are normal. Pupils are equal, round, and reactive to light.  Neck: Normal range of motion. Neck supple. No thyromegaly present.  Cardiovascular: Normal rate, regular rhythm and normal heart sounds.  Pulmonary/Chest: Effort normal and breath sounds normal. No respiratory distress.  Abdominal: Soft. Bowel sounds are normal.  Musculoskeletal: Normal range of motion. She exhibits no edema.  Lymphadenopathy:    She has no cervical adenopathy.  Neurological: She is alert and oriented to person, place, and time.  Gait normal  Skin: Skin is warm and dry.  Psychiatric: She has a normal mood and affect. Her behavior is normal. Thought content normal.  Nursing note and vitals reviewed.   ASSESSMENT/PLAN:  1. Essential hypertension controlled - COMPLETE METABOLIC PANEL WITH GFR - Lipid panel - CBC  2. Social anxiety disorder On Lexapro  3. Encounter for hepatitis C screening test for low risk patient discussed - Hepatitis C antibody  4. Encounter for screening for HIV discussed - HIV antibody  5. Insomnia, unspecified type Reviewed basics of good sleep.   Patient Instructions  Need lab testing Try melatonin for the sleep Take an hour before sleep Consider a relaxation video You need to practice this daily for it to work  Continue to try to reduce the smoking  need follow up in  six months You will see Dr Mannie Stabile next visit    Raylene Everts, MD

## 2017-08-06 NOTE — Patient Instructions (Addendum)
Need lab testing Try melatonin for the sleep Take an hour before sleep Consider a relaxation video You need to practice this daily for it to work  Continue to try to reduce the smoking  need follow up in six months You will see Dr Mannie Stabile next visit

## 2017-08-07 LAB — COMPLETE METABOLIC PANEL WITH GFR
AG Ratio: 2 (calc) (ref 1.0–2.5)
ALBUMIN MSPROF: 4.6 g/dL (ref 3.6–5.1)
ALKALINE PHOSPHATASE (APISO): 65 U/L (ref 33–130)
ALT: 11 U/L (ref 6–29)
AST: 20 U/L (ref 10–35)
BUN/Creatinine Ratio: 41 (calc) — ABNORMAL HIGH (ref 6–22)
BUN: 29 mg/dL — AB (ref 7–25)
CHLORIDE: 101 mmol/L (ref 98–110)
CO2: 31 mmol/L (ref 20–32)
CREATININE: 0.7 mg/dL (ref 0.50–0.99)
Calcium: 9.6 mg/dL (ref 8.6–10.4)
GFR, EST AFRICAN AMERICAN: 108 mL/min/{1.73_m2} (ref >=60)
GFR, Est Non African American: 94 mL/min/{1.73_m2} (ref >=60)
GLUCOSE: 72 mg/dL (ref 65–99)
Globulin: 2.3 g/dL (calc) (ref 1.9–3.7)
Potassium: 4.2 mmol/L (ref 3.5–5.3)
Sodium: 139 mmol/L (ref 135–146)
TOTAL PROTEIN: 6.9 g/dL (ref 6.1–8.1)
Total Bilirubin: 0.3 mg/dL (ref 0.2–1.2)

## 2017-08-07 LAB — HEPATITIS C ANTIBODY
Hepatitis C Ab: NONREACTIVE
SIGNAL TO CUT-OFF: 0.06 (ref <1.00)

## 2017-08-07 LAB — CBC
HEMATOCRIT: 40.5 % (ref 35.0–45.0)
Hemoglobin: 13.3 g/dL (ref 11.7–15.5)
MCH: 29.3 pg (ref 27.0–33.0)
MCHC: 32.8 g/dL (ref 32.0–36.0)
MCV: 89.2 fL (ref 80.0–100.0)
MPV: 10.7 fL (ref 7.5–12.5)
PLATELETS: 324 10*3/uL (ref 140–400)
RBC: 4.54 10*6/uL (ref 3.80–5.10)
RDW: 12.5 % (ref 11.0–15.0)
WBC: 8 10*3/uL (ref 3.8–10.8)

## 2017-08-07 LAB — LIPID PANEL
CHOL/HDL RATIO: 2.7 (calc) (ref <5.0)
CHOLESTEROL: 208 mg/dL — AB (ref <200)
HDL: 77 mg/dL (ref >50)
LDL CHOLESTEROL (CALC): 117 mg/dL — AB
Non-HDL Cholesterol (Calc): 131 mg/dL (calc) — ABNORMAL HIGH (ref <130)
TRIGLYCERIDES: 53 mg/dL (ref <150)

## 2017-08-07 LAB — HIV ANTIBODY (ROUTINE TESTING W REFLEX): HIV 1&2 Ab, 4th Generation: NONREACTIVE

## 2017-08-10 ENCOUNTER — Encounter: Payer: Self-pay | Admitting: Family Medicine

## 2017-09-24 ENCOUNTER — Encounter (INDEPENDENT_AMBULATORY_CARE_PROVIDER_SITE_OTHER): Payer: Self-pay | Admitting: *Deleted

## 2017-10-15 DIAGNOSIS — M5416 Radiculopathy, lumbar region: Secondary | ICD-10-CM | POA: Diagnosis not present

## 2017-11-05 ENCOUNTER — Encounter: Payer: Self-pay | Admitting: Family Medicine

## 2017-11-06 ENCOUNTER — Encounter: Payer: Self-pay | Admitting: Family Medicine

## 2017-11-11 ENCOUNTER — Other Ambulatory Visit: Payer: Self-pay | Admitting: Family Medicine

## 2017-11-16 DIAGNOSIS — M5416 Radiculopathy, lumbar region: Secondary | ICD-10-CM | POA: Diagnosis not present

## 2017-11-20 ENCOUNTER — Other Ambulatory Visit (INDEPENDENT_AMBULATORY_CARE_PROVIDER_SITE_OTHER): Payer: Self-pay | Admitting: *Deleted

## 2017-11-20 DIAGNOSIS — Z1211 Encounter for screening for malignant neoplasm of colon: Secondary | ICD-10-CM

## 2018-01-05 ENCOUNTER — Encounter (INDEPENDENT_AMBULATORY_CARE_PROVIDER_SITE_OTHER): Payer: Self-pay | Admitting: *Deleted

## 2018-01-05 ENCOUNTER — Telehealth (INDEPENDENT_AMBULATORY_CARE_PROVIDER_SITE_OTHER): Payer: Self-pay | Admitting: *Deleted

## 2018-01-05 MED ORDER — SUPREP BOWEL PREP KIT 17.5-3.13-1.6 GM/177ML PO SOLN: 1.0000 | Freq: Once | ORAL | 0 refills | Status: AC

## 2018-01-05 NOTE — Telephone Encounter (Signed)
Patient needs suprep 

## 2018-01-08 ENCOUNTER — Telehealth (INDEPENDENT_AMBULATORY_CARE_PROVIDER_SITE_OTHER): Payer: Self-pay | Admitting: *Deleted

## 2018-01-08 NOTE — Telephone Encounter (Signed)
agree

## 2018-01-08 NOTE — Telephone Encounter (Signed)
Referring MD/PCP: hagler   Procedure: tcs  Reason/Indication:  screening  Has patient had this procedure before?  no  If so, when, by whom and where?    Is there a family history of colon cancer?  no  Who?  What age when diagnosed?    Is patient diabetic?   no      Does patient have prosthetic heart valve or mechanical valve?  no  Do you have a pacemaker?  no  Has patient ever had endocarditis? no  Has patient had joint replacement within last 12 months?  no  Is patient constipated or do they take laxatives? no  Does patient have a history of alcohol/drug use?  no  Is patient on blood thinner such as Coumadin, Plavix and/or Aspirin? no  Medications: lisinopril 20/25 mg daily, escitalopram 20 mg 1/2 tab daily  11/16/17 NO NEED FOR PROPOFOL PER DR REHMAN  Allergies: nkda  Medication Adjustment per Dr Lindi Adie, NP:   Procedure date & time: 02/03/18

## 2018-02-09 ENCOUNTER — Ambulatory Visit: Payer: BLUE CROSS/BLUE SHIELD | Admitting: Family Medicine

## 2018-02-12 DIAGNOSIS — M25511 Pain in right shoulder: Secondary | ICD-10-CM | POA: Diagnosis not present

## 2018-03-02 ENCOUNTER — Encounter (INDEPENDENT_AMBULATORY_CARE_PROVIDER_SITE_OTHER): Payer: Self-pay | Admitting: *Deleted

## 2018-03-22 ENCOUNTER — Telehealth (INDEPENDENT_AMBULATORY_CARE_PROVIDER_SITE_OTHER): Payer: Self-pay | Admitting: *Deleted

## 2018-03-22 NOTE — Telephone Encounter (Signed)
agree

## 2018-03-22 NOTE — Telephone Encounter (Signed)
Referring MD/PCP: hagler   Procedure: tcs  Reason/Indication:  screening  Has patient had this procedure before?  no             If so, when, by whom and where?    Is there a family history of colon cancer?  no             Who?  What age when diagnosed?    Is patient diabetic?   no                                                  Does patient have prosthetic heart valve or mechanical valve?  no  Do you have a pacemaker?  no  Has patient ever had endocarditis? no  Has patient had joint replacement within last 12 months?  no  Is patient constipated or do they take laxatives? no  Does patient have a history of alcohol/drug use?  no  Is patient on blood thinner such as Coumadin, Plavix and/or Aspirin? no  Medications: lisinopril 20/25 mg daily, escitalopram 20 mg 1/2 tab daily  11/16/17 NO NEED FOR PROPOFOL PER DR REHMAN  Allergies: nkda  Medication Adjustment per Dr Lindi Adie, NP:   Procedure date & time: 04/21/18 at 1200

## 2018-03-24 ENCOUNTER — Other Ambulatory Visit (HOSPITAL_COMMUNITY): Payer: Self-pay | Admitting: Family Medicine

## 2018-03-24 DIAGNOSIS — Z1231 Encounter for screening mammogram for malignant neoplasm of breast: Secondary | ICD-10-CM

## 2018-04-12 ENCOUNTER — Ambulatory Visit (HOSPITAL_COMMUNITY)
Admission: RE | Admit: 2018-04-12 | Discharge: 2018-04-12 | Disposition: A | Discharge status: Home / Self Care | Payer: BLUE CROSS/BLUE SHIELD | Source: Ambulatory Visit | Attending: Family Medicine | Admitting: Family Medicine

## 2018-04-12 DIAGNOSIS — Z1231 Encounter for screening mammogram for malignant neoplasm of breast: Secondary | ICD-10-CM | POA: Insufficient documentation

## 2018-04-19 DIAGNOSIS — I10 Essential (primary) hypertension: Secondary | ICD-10-CM | POA: Diagnosis not present

## 2018-04-19 DIAGNOSIS — Z23 Encounter for immunization: Secondary | ICD-10-CM | POA: Diagnosis not present

## 2018-04-19 DIAGNOSIS — Z6830 Body mass index (BMI) 30.0-30.9, adult: Secondary | ICD-10-CM | POA: Diagnosis not present

## 2018-04-19 DIAGNOSIS — F17200 Nicotine dependence, unspecified, uncomplicated: Secondary | ICD-10-CM | POA: Diagnosis not present

## 2018-04-21 ENCOUNTER — Ambulatory Visit (HOSPITAL_COMMUNITY)
Admission: RE | Admit: 2018-04-21 | Discharge: 2018-04-21 | Disposition: A | Discharge status: Home / Self Care | Payer: BLUE CROSS/BLUE SHIELD | Source: Ambulatory Visit | Attending: Internal Medicine | Admitting: Internal Medicine

## 2018-04-21 ENCOUNTER — Other Ambulatory Visit: Payer: Self-pay

## 2018-04-21 ENCOUNTER — Encounter (HOSPITAL_COMMUNITY)
Admission: RE | Disposition: A | Discharge status: Home / Self Care | Payer: Self-pay | Source: Ambulatory Visit | Attending: Internal Medicine

## 2018-04-21 ENCOUNTER — Encounter (HOSPITAL_COMMUNITY): Payer: Self-pay | Admitting: *Deleted

## 2018-04-21 DIAGNOSIS — Z86010 Personal history of colonic polyps: Secondary | ICD-10-CM | POA: Insufficient documentation

## 2018-04-21 DIAGNOSIS — I10 Essential (primary) hypertension: Secondary | ICD-10-CM | POA: Diagnosis not present

## 2018-04-21 DIAGNOSIS — K6389 Other specified diseases of intestine: Secondary | ICD-10-CM | POA: Diagnosis not present

## 2018-04-21 DIAGNOSIS — Z79899 Other long term (current) drug therapy: Secondary | ICD-10-CM | POA: Diagnosis not present

## 2018-04-21 DIAGNOSIS — K5730 Diverticulosis of large intestine without perforation or abscess without bleeding: Secondary | ICD-10-CM | POA: Insufficient documentation

## 2018-04-21 DIAGNOSIS — Z09 Encounter for follow-up examination after completed treatment for conditions other than malignant neoplasm: Secondary | ICD-10-CM | POA: Diagnosis not present

## 2018-04-21 DIAGNOSIS — D125 Benign neoplasm of sigmoid colon: Secondary | ICD-10-CM | POA: Insufficient documentation

## 2018-04-21 DIAGNOSIS — Z1211 Encounter for screening for malignant neoplasm of colon: Secondary | ICD-10-CM | POA: Diagnosis not present

## 2018-04-21 DIAGNOSIS — F17210 Nicotine dependence, cigarettes, uncomplicated: Secondary | ICD-10-CM | POA: Diagnosis not present

## 2018-04-21 HISTORY — PX: BIOPSY: SHX5522

## 2018-04-21 HISTORY — PX: COLONOSCOPY: SHX5424

## 2018-04-21 SURGERY — COLONOSCOPY
Anesthesia: Moderate Sedation

## 2018-04-21 MED ORDER — MEPERIDINE HCL 50 MG/ML IJ SOLN
INTRAMUSCULAR | Status: AC
  Filled 2018-04-21: qty 1

## 2018-04-21 MED ORDER — SODIUM CHLORIDE 0.9 % IV SOLN
INTRAVENOUS | Status: DC
  Administered 2018-04-21: 1000 mL via INTRAVENOUS

## 2018-04-21 MED ORDER — MEPERIDINE HCL 50 MG/ML IJ SOLN
INTRAMUSCULAR | Status: DC | PRN
  Administered 2018-04-21: 25 mg

## 2018-04-21 MED ORDER — MIDAZOLAM HCL 5 MG/5ML IJ SOLN
INTRAMUSCULAR | Status: AC
  Filled 2018-04-21: qty 10

## 2018-04-21 MED ORDER — MIDAZOLAM HCL 5 MG/5ML IJ SOLN
INTRAMUSCULAR | Status: DC | PRN
  Administered 2018-04-21: 2 mg via INTRAVENOUS
  Administered 2018-04-21: 1 mg via INTRAVENOUS
  Administered 2018-04-21: 2 mg via INTRAVENOUS

## 2018-04-21 NOTE — Progress Notes (Signed)
Patient states "I have redness where I had a pneumonia vaccine in my left arm from Monday". Noted erythema to left mid upper lateral  arm. Patient with her cousin Cheron Every. Patient and cousin verbalized they "are going to call her primary doctor to get it checked".

## 2018-04-21 NOTE — Op Note (Addendum)
[facility] Patient Name: Megan Schaefer Procedure Date: 04/21/2018 11:10 AM MRN: [id number] Date of Birth: July 13, 1955 Attending MD: Hildred Laser , MD CSN: [id number] Age: 62 Admit Type: Outpatient Procedure:                Colonoscopy Indications:              High risk colon cancer surveillance: Personal                            history of colonic polyps Providers:                Hildred Laser, MD, Janeece Riggers, RN, Randa Spike,                            Technician Referring MD:             Chapman Fitch, MD Medicines:                Meperidine 25 mg IV, Midazolam 5 mg IV Complications:            No immediate complications. Estimated Blood Loss:     Estimated blood loss was minimal. Procedure:                Pre-Anesthesia Assessment:                           - Prior to the procedure, a History and Physical                            was performed, and patient medications and                            allergies were reviewed. The patient's tolerance of                            previous anesthesia was also reviewed. The risks                            and benefits of the procedure and the sedation                            options and risks were discussed with the patient.                            All questions were answered, and informed consent                            was obtained. Prior Anticoagulants: The patient has                            taken no previous anticoagulant or antiplatelet                            agents. ASA Grade Assessment: II - A patient with  mild systemic disease. After reviewing the risks                            and benefits, the patient was deemed in                            satisfactory condition to undergo the procedure.                           After obtaining informed consent, the colonoscope                            was passed under direct vision. Throughout the                             procedure, the patient's blood pressure, pulse, and                            oxygen saturations were monitored continuously. The                            CF-HQ190L ([id number]) scope was introduced through                            the anus and advanced to the the cecum, identified                            by appendiceal orifice and ileocecal valve. The                            colonoscopy was performed without difficulty. The                            patient tolerated the procedure well. The quality                            of the bowel preparation was excellent. The                            ileocecal valve, appendiceal orifice, and rectum                            were photographed. Scope In: 11:37:46 AM Scope Out: 12:00:50 PM Scope Withdrawal Time: 0 hours 12 minutes 6 seconds  Total Procedure Duration: 0 hours 23 minutes 4 seconds  Findings:      The perianal and digital rectal examinations were normal.      A diffuse area of severe melanosis was found in the entire colon.      A few small-mouthed diverticula were found in the transverse colon.      A small polyp was found in the proximal sigmoid colon. The polyp was       sessile. Biopsies were taken with a cold forceps for histology.      No additional abnormalities were found on retroflexion. Impression:               -  Melanosis in the colon.                           - Diverticulosis in the transverse colon.                           - One small polyp in the proximal sigmoid colon.                            Biopsied. Moderate Sedation:      Moderate (conscious) sedation was administered by the endoscopy nurse       and supervised by the endoscopist. The following parameters were       monitored: oxygen saturation, heart rate, blood pressure, CO2       capnography and response to care. Total physician intraservice time was       28 minutes. Recommendation:           - Patient has a contact number available for                             emergencies. The signs and symptoms of potential                            delayed complications were discussed with the                            patient. Return to normal activities tomorrow.                            Written discharge instructions were provided to the                            patient.                           - High fiber diet today.                           - Continue present medications.                           - Await pathology results.                           - No aspirin, ibuprofen, naproxen, or other                            non-steroidal anti-inflammatory drugs for 1 day.                           - Repeat colonoscopy is recommended. The                            colonoscopy date will be determined after pathology  results from today's exam become available for                            review. Procedure Code(s):        --- Professional ---                           [phone], Colonoscopy, flexible; with biopsy, single                            or multiple                           99153, Moderate sedation; each additional 15                            minutes intraservice time                           G0500, Moderate sedation services provided by the                            same physician or other qualified health care                            professional performing a gastrointestinal                            endoscopic service that sedation supports,                            requiring the presence of an independent trained                            observer to assist in the monitoring of the                            patient's level of consciousness and physiological                            status; initial 15 minutes of intra-service time;                            patient age 27 years or older (additional time may                            be reported with [phone], as  appropriate) Diagnosis Code(s):        --- Professional ---                           Z86.010, Personal history of colonic polyps                           K63.89, Other specified diseases of intestine  D12.5, Benign neoplasm of sigmoid colon                           K57.30, Diverticulosis of large intestine without                            perforation or abscess without bleeding CPT copyright 2018 American [employer]. All rights reserved. The codes documented in this report are preliminary and upon coder review may  be revised to meet current compliance requirements. Hildred Laser, MD Hildred Laser, MD 04/21/2018 12:09:48 PM This report has been signed electronically. Number of Addenda: 0

## 2018-04-21 NOTE — Discharge Instructions (Signed)
No aspirin or NSAIDs for 24 hours. Resume usual medications as before. High-fiber diet. No driving for 24 hours. Physician will call with biopsy results.     Colonoscopy, Adult, Care After This sheet gives you information about how to care for yourself after your procedure. Your doctor may also give you more specific instructions. If you have problems or questions, call your doctor. Follow these instructions at home: General instructions   For the first 24 hours after the procedure: ? Do not drive or use machinery. ? Do not sign important documents. ? Do not drink alcohol. ? Do your daily activities more slowly than normal. ? Eat foods that are soft and easy to digest. ? Rest often.  Take over-the-counter or prescription medicines only as told by your doctor.  It is up to you to get the results of your procedure. Ask your doctor, or the department performing the procedure, when your results will be ready. To help cramping and bloating:  Try walking around.  Put heat on your belly (abdomen) as told by your doctor. Use a heat source that your doctor recommends, such as a moist heat pack or a heating pad. ? Put a towel between your skin and the heat source. ? Leave the heat on for 20-30 minutes. ? Remove the heat if your skin turns bright red. This is especially important if you cannot feel pain, heat, or cold. You can get burned. Eating and drinking  Drink enough fluid to keep your pee (urine) clear or pale yellow.  Return to your normal diet as told by your doctor. Avoid heavy or fried foods that are hard to digest.  Avoid drinking alcohol for as long as told by your doctor. Contact a doctor if:  You have blood in your poop (stool) 2-3 days after the procedure. Get help right away if:  You have more than a small amount of blood in your poop.  You see large clumps of tissue (blood clots) in your poop.  Your belly is swollen.  You feel sick to your stomach  (nauseous).  You throw up (vomit).  You have a fever.  You have belly pain that gets worse, and medicine does not help your pain. This information is not intended to replace advice given to you by your health care provider. Make sure you discuss any questions you have with your health care provider. Document Released: 06/21/2010 Document Revised: 02/11/2016 Document Reviewed: 02/11/2016 Elsevier Interactive Patient Education  2017 [employer]     Diverticulosis Diverticulosis is a condition that develops when small pouches (diverticula) form in the wall of the large intestine (colon). The colon is where water is absorbed and stool is formed. The pouches form when the inside layer of the colon pushes through weak spots in the outer layers of the colon. You may have a few pouches or many of them. What are the causes? The cause of this condition is not known. What increases the risk? The following factors may make you more likely to develop this condition:  Being older than age 28. Your risk for this condition increases with age. Diverticulosis is rare among people younger than age 59. By age 18, many people have it.  Eating a low-fiber diet.  Having frequent constipation.  Being overweight.  Not getting enough exercise.  Smoking.  Taking over-the-counter pain medicines, like aspirin and ibuprofen.  Having a family history of diverticulosis.  What are the signs or symptoms? In most people, there are no symptoms  of this condition. If you do have symptoms, they may include:  Bloating.  Cramps in the abdomen.  Constipation or diarrhea.  Pain in the lower left side of the abdomen.  How is this diagnosed? This condition is most often diagnosed during an exam for other colon problems. Because diverticulosis usually has no symptoms, it often cannot be diagnosed independently. This condition may be diagnosed by:  Using a flexible scope to examine the colon  (colonoscopy).  Taking an X-ray of the colon after dye has been put into the colon (barium enema).  Doing a CT scan.  How is this treated? You may not need treatment for this condition if you have never developed an infection related to diverticulosis. If you have had an infection before, treatment may include:  Eating a high-fiber diet. This may include eating more fruits, vegetables, and grains.  Taking a fiber supplement.  Taking a live bacteria supplement (probiotic).  Taking medicine to relax your colon.  Taking antibiotic medicines.  Follow these instructions at home:  Drink 6-8 glasses of water or more each day to prevent constipation.  Try not to strain when you have a bowel movement.  If you have had an infection before: ? Eat more fiber as directed by your health care provider or your diet and nutrition specialist (dietitian). ? Take a fiber supplement or probiotic, if your health care provider approves.  Take over-the-counter and prescription medicines only as told by your health care provider.  If you were prescribed an antibiotic, take it as told by your health care provider. Do not stop taking the antibiotic even if you start to feel better.  Keep all follow-up visits as told by your health care provider. This is important. Contact a health care provider if:  You have pain in your abdomen.  You have bloating.  You have cramps.  You have not had a bowel movement in 3 days. Get help right away if:  Your pain gets worse.  Your bloating becomes very bad.  You have a fever or chills, and your symptoms suddenly get worse.  You vomit.  You have bowel movements that are bloody or black.  You have bleeding from your rectum. Summary  Diverticulosis is a condition that develops when small pouches (diverticula) form in the wall of the large intestine (colon).  You may have a few pouches or many of them.  This condition is most often diagnosed during an  exam for other colon problems.  If you have had an infection related to diverticulosis, treatment may include increasing the fiber in your diet, taking supplements, or taking medicines. This information is not intended to replace advice given to you by your health care provider. Make sure you discuss any questions you have with your health care provider. Document Released: 02/14/2004 Document Revised: 04/07/2016 Document Reviewed: 04/07/2016 Elsevier Interactive Patient Education  2017 [employer].    High-Fiber Diet Fiber, also called dietary fiber, is a type of carbohydrate found in fruits, vegetables, whole grains, and beans. A high-fiber diet can have many health benefits. Your health care provider may recommend a high-fiber diet to help:  Prevent constipation. Fiber can make your bowel movements more regular.  Lower your cholesterol.  Relieve hemorrhoids, uncomplicated diverticulosis, or irritable bowel syndrome.  Prevent overeating as part of a weight-loss plan.  Prevent heart disease, type 2 diabetes, and certain cancers.  What is my plan? The recommended daily intake of fiber includes:  38 grams for men under age  50.  30 grams for men over age 38.  54 grams for women under age 58.  65 grams for women over age 62.  You can get the recommended daily intake of dietary fiber by eating a variety of fruits, vegetables, grains, and beans. Your health care provider may also recommend a fiber supplement if it is not possible to get enough fiber through your diet. What do I need to know about a high-fiber diet?  Fiber supplements have not been widely studied for their effectiveness, so it is better to get fiber through food sources.  Always check the fiber content on thenutrition facts label of any prepackaged food. Look for foods that contain at least 5 grams of fiber per serving.  Ask your dietitian if you have questions about specific foods that are related to your  condition, especially if those foods are not listed in the following section.  Increase your daily fiber consumption gradually. Increasing your intake of dietary fiber too quickly may cause bloating, cramping, or gas.  Drink plenty of water. Water helps you to digest fiber. What foods can I eat? Grains Whole-grain breads. Multigrain cereal. Oats and oatmeal. Brown rice. Barley. Bulgur wheat. [place]. Bran muffins. Popcorn. Rye wafer crackers. Vegetables Sweet potatoes. Spinach. Kale. Artichokes. Cabbage. Broccoli. Green peas. Carrots. Squash. Fruits Berries. Pears. Apples. Oranges. Avocados. Prunes and raisins. Dried figs. Meats and Other Protein Sources Navy, kidney, pinto, and soy beans. Split peas. Lentils. Nuts and seeds. Dairy Fiber-fortified yogurt. Beverages Fiber-fortified soy milk. Fiber-fortified orange juice. Other Fiber bars. The items listed above may not be a complete list of recommended foods or beverages. Contact your dietitian for more options. What foods are not recommended? Grains White bread. Pasta made with refined flour. White rice. Vegetables Fried potatoes. Canned vegetables. Well-cooked vegetables. Fruits Fruit juice. Cooked, strained fruit. Meats and Other Protein Sources Fatty cuts of meat. Fried Sales executive or fried fish. Dairy Milk. Yogurt. Cream cheese. Sour cream. Beverages Soft drinks. Other Cakes and pastries. Butter and oils. The items listed above may not be a complete list of foods and beverages to avoid. Contact your dietitian for more information. What are some tips for including high-fiber foods in my diet?  Eat a wide variety of high-fiber foods.  Make sure that half of all grains consumed each day are whole grains.  Replace breads and cereals made from refined flour or white flour with whole-grain breads and cereals.  Replace white rice with brown rice, bulgur wheat, or millet.  Start the day with a breakfast that is high in fiber,  such as a cereal that contains at least 5 grams of fiber per serving.  Use beans in place of meat in soups, salads, or pasta.  Eat high-fiber snacks, such as berries, raw vegetables, nuts, or popcorn. This information is not intended to replace advice given to you by your health care provider. Make sure you discuss any questions you have with your health care provider. Document Released: 05/19/2005 Document Revised: 10/25/2015 Document Reviewed: 11/01/2013 Elsevier Interactive Patient Education  [employer].

## 2018-04-21 NOTE — H&P (Signed)
Megan Schaefer is an 62 y.o. female.   Chief Complaint: Patient is here for colonoscopy. HPI: Patient is 62 year old African-American female who is here for surveillance exam.  She had small adenoma removed in April 2009.  None was found on her last exam 5 years ago.  She denies abdominal pain change in bowel habits or rectal bleeding. There was a question of family history of CRC but updated information reveals a family history is negative.   Past Medical History:  Diagnosis Date  . Anxiety   . Arthritis   . Depression   . Eczema   . Hypertension     Past Surgical History:  Procedure Laterality Date  . COLONOSCOPY N/A 10/20/2012   Procedure: COLONOSCOPY;  Surgeon: [name], MD;  Location: [facility];  Service: Endoscopy;  Laterality: N/A;  930  . FINGER GANGLION CYST EXCISION Right    pinky  . KNEE SURGERY Right     Family History  Problem Relation Age of Onset  . Hearing loss Mother   . Hyperlipidemia Mother   . Liver disease Mother        died at 25  . Arthritis Father   . COPD Father   . Heart disease Father   . Hypertension Father   . Alzheimer's disease Maternal Aunt    Social History:  reports that she has been smoking. She has a 6.25 pack-year smoking history. She has never used smokeless tobacco. She reports that she drinks about 10.0 standard drinks of alcohol per week. She reports that she does not use drugs.  Allergies: No Known Allergies  Medications Prior to Admission  Medication Sig Dispense Refill  . lisinopril-hydrochlorothiazide (PRINZIDE,ZESTORETIC) 20-25 MG tablet Take 1 tablet by mouth daily. 90 tablet 3  . Probiotic Product (HEALTHY COLON PO) Take 1 capsule by mouth 2 (two) times daily.    Marland Kitchen escitalopram (LEXAPRO) 20 MG tablet TAKE 1 TABLET IN THE EVENING. 90 tablet 0    No results found for this or any previous visit (from the past 48 hour(s)). No results found.  ROS  Blood pressure 107/75, pulse 97, temperature (!) 97.5 F (36.4 C),  temperature source Oral, resp. rate 17, height 5\' 4"  (1.626 m), weight 77.1 kg, SpO2 100 %. Physical Exam  Constitutional: She appears well-developed and well-nourished.  HENT:  Mouth/Throat: Oropharynx is clear and moist.  Eyes: Conjunctivae are normal. No scleral icterus.  Neck: No thyromegaly present.  Cardiovascular: Normal rate, regular rhythm and intact distal pulses.  No murmur heard. Respiratory: Effort normal and breath sounds normal.  GI: Soft. She exhibits no distension and no mass. There is no tenderness.  Musculoskeletal: She exhibits no edema.  Lymphadenopathy:    She has no cervical adenopathy.  Neurological: She is alert.  Skin: Skin is warm and dry.     Assessment/Plan History of colonic adenoma. Surveillance colonoscopy  Hildred Laser, MD 04/21/2018, 11:20 AM

## 2018-04-26 ENCOUNTER — Encounter (HOSPITAL_COMMUNITY): Payer: Self-pay | Admitting: Internal Medicine

## 2018-05-07 ENCOUNTER — Other Ambulatory Visit (HOSPITAL_COMMUNITY): Payer: Self-pay | Admitting: Orthopedic Surgery

## 2018-05-07 DIAGNOSIS — M25511 Pain in right shoulder: Secondary | ICD-10-CM | POA: Diagnosis not present

## 2018-05-14 ENCOUNTER — Ambulatory Visit (HOSPITAL_COMMUNITY): Payer: BLUE CROSS/BLUE SHIELD

## 2018-05-17 ENCOUNTER — Ambulatory Visit (HOSPITAL_COMMUNITY)
Admission: RE | Admit: 2018-05-17 | Discharge: 2018-05-17 | Disposition: A | Discharge status: Home / Self Care | Payer: BLUE CROSS/BLUE SHIELD | Source: Ambulatory Visit | Attending: Orthopedic Surgery | Admitting: Orthopedic Surgery

## 2018-05-17 DIAGNOSIS — M25511 Pain in right shoulder: Secondary | ICD-10-CM | POA: Diagnosis not present

## 2018-05-17 DIAGNOSIS — M19011 Primary osteoarthritis, right shoulder: Secondary | ICD-10-CM | POA: Insufficient documentation

## 2018-05-17 DIAGNOSIS — M75111 Incomplete rotator cuff tear or rupture of right shoulder, not specified as traumatic: Secondary | ICD-10-CM | POA: Insufficient documentation

## 2018-05-21 DIAGNOSIS — M25511 Pain in right shoulder: Secondary | ICD-10-CM | POA: Diagnosis not present

## 2018-06-15 DIAGNOSIS — M25511 Pain in right shoulder: Secondary | ICD-10-CM | POA: Diagnosis not present

## 2018-07-12 DIAGNOSIS — M19011 Primary osteoarthritis, right shoulder: Secondary | ICD-10-CM | POA: Diagnosis not present

## 2018-07-12 DIAGNOSIS — S43431A Superior glenoid labrum lesion of right shoulder, initial encounter: Secondary | ICD-10-CM | POA: Diagnosis not present

## 2018-07-12 DIAGNOSIS — G8918 Other acute postprocedural pain: Secondary | ICD-10-CM | POA: Diagnosis not present

## 2018-07-12 DIAGNOSIS — M7541 Impingement syndrome of right shoulder: Secondary | ICD-10-CM | POA: Diagnosis not present

## 2018-07-12 DIAGNOSIS — Y999 Unspecified external cause status: Secondary | ICD-10-CM | POA: Diagnosis not present

## 2018-07-12 DIAGNOSIS — S46011A Strain of muscle(s) and tendon(s) of the rotator cuff of right shoulder, initial encounter: Secondary | ICD-10-CM | POA: Diagnosis not present

## 2018-07-12 DIAGNOSIS — S43491A Other sprain of right shoulder joint, initial encounter: Secondary | ICD-10-CM | POA: Diagnosis not present

## 2018-07-12 DIAGNOSIS — M948X1 Other specified disorders of cartilage, shoulder: Secondary | ICD-10-CM | POA: Diagnosis not present

## 2018-07-12 DIAGNOSIS — X58XXXA Exposure to other specified factors, initial encounter: Secondary | ICD-10-CM | POA: Diagnosis not present

## 2018-07-29 ENCOUNTER — Other Ambulatory Visit: Payer: Self-pay

## 2018-07-29 ENCOUNTER — Ambulatory Visit (HOSPITAL_COMMUNITY): Payer: BLUE CROSS/BLUE SHIELD | Attending: Specialist

## 2018-07-29 ENCOUNTER — Encounter (HOSPITAL_COMMUNITY): Payer: Self-pay

## 2018-07-29 DIAGNOSIS — M25511 Pain in right shoulder: Secondary | ICD-10-CM | POA: Diagnosis not present

## 2018-07-29 DIAGNOSIS — M25611 Stiffness of right shoulder, not elsewhere classified: Secondary | ICD-10-CM | POA: Diagnosis not present

## 2018-07-29 DIAGNOSIS — R29898 Other symptoms and signs involving the musculoskeletal system: Secondary | ICD-10-CM

## 2018-07-29 NOTE — Therapy (Signed)
[facility] [address] [place], [place], [address] Phone: [phone]   Fax:  [phone]  Occupational Therapy Evaluation  Patient Details  Name: Megan Schaefer MRN: [id number] Date of Birth: 1955/07/22 Referring Provider (OT): [name], MD   Encounter Date: 07/29/2018  OT End of Session - 07/29/18 1700    Visit Number  1    Number of Visits  24    Date for OT Re-Evaluation  10/21/18   mini reasses: 08/26/18   Authorization Type  BC[employer]     Authorization Time Period  covered 100%. 30 visits for OT/PT/Chiro. 0 used.     Authorization - Visit Number  1    Authorization - Number of Visits  30    OT Start Time  1435    OT Stop Time  1515    OT Time Calculation (min)  40 min    Activity Tolerance  Patient tolerated treatment well    Behavior During Therapy  WFL for tasks assessed/performed       Past Medical History:  Diagnosis Date  . Anxiety   . Arthritis   . Depression   . Eczema   . Hypertension     Past Surgical History:  Procedure Laterality Date  . BIOPSY  04/21/2018   Procedure: BIOPSY;  Surgeon: [name], MD;  [facility];  Service: Endoscopy;;  . COLONOSCOPY N/A 10/20/2012   Procedure: COLONOSCOPY;  Surgeon: [name], MD;  [facility];  Service: Endoscopy;  Laterality: N/A;  930  . COLONOSCOPY N/A 04/21/2018   Procedure: COLONOSCOPY;  Surgeon: [name], MD;  [facility];  Service: Endoscopy;  Laterality: N/A;  930  . FINGER GANGLION CYST EXCISION Right    pinky  . KNEE SURGERY Right     There were no vitals filed for this visit.  Subjective Assessment - 07/29/18 1443    Subjective   S: I just don't like this sling. It's hard to keep my arm positioned and I feel like I'm holding my arm up instead of the sling.     Pertinent History  Patient is a 63 y/o female S/P right RTC repair on 07/12/18. Patient is currently in a sling. Dr. [name] has referred  patient to occupational therapy for evaluation and treatment.     Special Tests  FOTO score: 4/100    Patient Stated Goals  To gain full use of my arm.    Currently in Pain?  Yes    Pain Score  3     Pain Location  Shoulder    Pain Orientation  Right    Pain Descriptors / Indicators  Aching    Pain Type  Surgical pain    Pain Radiating Towards  sometimes up to neck and down the arm.    Pain Onset  1 to 4 weeks ago    Pain Frequency  Intermittent    Aggravating Factors   Use of arm.    Pain Relieving Factors  rest, ice    Effect of Pain on Daily Activities  Pt is unable to utilized RUE for any daily tasks.     Multiple Pain Sites  No        [facility] OT Assessment - 07/29/18 1445      Assessment   Medical Diagnosis  right shoulder RTC repair    Referring Provider (OT)  [name], MD    Onset Date/Surgical Date  07/12/18  Hand Dominance  Right    Next MD Visit  --   4-6 weeks   Prior Therapy  None for this condition.      Precautions   Precautions  Shoulder    Type of Shoulder Precautions  2 weeks of P/ROM. (until 08/09/18) D/C sling on 08/09/18. (3/9-4/6): AA/ROM and A/ROM. 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       ADL   ADL comments  Unable to do any activity with her RUE.       Mobility   Mobility Status  Independent      Written Expression   Dominant Hand  Right      Vision - History   Baseline Vision  Wears glasses all the time      Cognition   Overall Cognitive Status  Within Functional Limits for tasks assessed      Observation/Other Assessments   Focus on  Therapeutic Outcomes (FOTO)   4/100      ROM / Strength   AROM / PROM / Strength  AROM;PROM;Strength      Palpation   Palpation comment  Min fascial restrictions in her right upper arm, trapezius, and scapularis region.       AROM   Overall AROM   Unable to assess;Due to precautions      PROM   Overall PROM   Deficits    Overall PROM Comments  Assessed supine. IR/er adducted    PROM Assessment Site  Shoulder    Right/Left Shoulder  Right    Right Shoulder Flexion  100 Degrees    Right Shoulder ABduction  100 Degrees    Right Shoulder Internal Rotation  90 Degrees    Right Shoulder External Rotation  20 Degrees      Strength   Overall Strength  Unable to assess;Due to precautions                      OT Education - 07/29/18 1659    Education Details  table slide (demonstrated at countertop), pendulums, A/ROM wrist and elbow. sling donning/doffing and arm positioning when in the sling, in bed, and seated in recliner.     Person(s) Educated  Patient    Methods  Explanation;Demonstration;Verbal cues;Handout    Comprehension  Returned demonstration;Verbalized understanding       OT Short Term Goals - 07/29/18 1706      OT SHORT TERM GOAL #1   Title  Patient will be educated and independent with her HEP in order to faciliate her progress in therapy and allow her to return to using her RUE as her dominant extremity for all daily tasks.     Time  6    Period  Weeks    Status  New    Target Date  09/09/18      OT SHORT TERM GOAL #2   Title  Patient will increase her RUE P/ROM to WNL in order to increase her ability to  complete upper body dressing tasks and grooming tasks with less difficulty.     Time  6    Period  Weeks    Status  New      OT SHORT TERM GOAL #3   Title  Patient will increase RUE strength to 3+/5 in order to complete lightweight household lifting tasks at waist level.     Time  6    Period  Weeks    Status  New      OT SHORT TERM GOAL #4    Title  Patient will decrease fascial restrictions to trace amount in her RUE in order to increase the functional mobility needed to complete reaching tasks at shoulder level.     Time  6    Period  Weeks    Status  New      OT SHORT TERM GOAL #5   Title  Patient will report a decrease in pain when completing waist level daily tasks with her RUE of approximately 4/10.     Time  6    Period  Weeks    Status  New        OT Long Term Goals - 07/29/18 1727      OT LONG TERM GOAL #1   Title  Patient will increase RUE A/ROM to [facility] in order to complete functional reaching tasks above shoulder or overhead.     Time  12    Period  Weeks    Status  New    Target Date  10/21/18      OT LONG TERM GOAL #2   Title  Patient will increase RUE strength to 5/5 in order to return to normal yardwork activity and exercise.     Time  12    Period  Weeks    Status  New      OT LONG TERM GOAL #3   Title  Patient will report a decrease in pain of approximately 2/10 while completing desired daily tasks.     Time  12    Period  Weeks    Status  New            Plan - 07/29/18 1702    Clinical Impression Statement  A: Patient is a 63 y/o female S/P right RTC repair causing increased pain, fascial restrictions, decreased strength and ROM resulting in difficulty completing any daily task utilizing her RUE.     Occupational Profile and client history currently impacting functional performance  Patient is motivated to return to prior level of function.     Occupational performance deficits (Please refer to evaluation for details):  ADL's;IADL's;Rest and Sleep;Leisure    Rehab Potential  Excellent    Current Impairments/barriers affecting progress:  None noted.     OT Frequency  2x / week    OT Duration  12 weeks    OT Treatment/Interventions  Self-care/ADL training;Electrical Stimulation;Therapeutic exercise;Patient/family education;Neuromuscular education;Moist Heat;Therapeutic activities;Passive  range of motion;Manual Therapy;DME and/or AE instruction;Ultrasound;Cryotherapy    Plan  P: Patient will benefit from skilled OT services to increase functional performance during daily tasks while using her RUE as her dominant extremity. Treatment Plan: Myofascial release, manual stretching, P/ROM, AA/ROM, A/ROM, general shoulder and scapular strengthening. Modalities PRN.     Clinical Decision Making  Several treatment options, min-mod task modification necessary    Consulted and Agree with Plan of Care  Patient       Patient will benefit from skilled therapeutic intervention in order to  improve the following deficits and impairments:  Pain, Increased fascial restrictions, Decreased range of motion, Decreased strength, Impaired UE functional use  Visit Diagnosis: Other symptoms and signs involving the musculoskeletal system - Plan: Ot plan of care cert/re-cert  Acute pain of right shoulder - Plan: Ot plan of care cert/re-cert  Stiffness of right shoulder, not elsewhere classified - Plan: Ot plan of care cert/re-cert    Problem List Patient Active Problem List   Diagnosis Date Noted  . Special screening for malignant neoplasms, colon 11/20/2017  . Hypertension 07/03/2017  . Social anxiety disorder 07/03/2017  . Tobacco abuse 07/03/2017  . Tubular adenoma 07/03/2017   Ailene Ravel, OTR/L,CBIS  [phone]  07/29/2018, 5:33 PM  [facility] [address] [place], [place], [address] Phone: [phone]   Fax:  [phone]  Name: GERLEAN CID MRN: [id number] Date of Birth: [birth year]

## 2018-07-29 NOTE — Patient Instructions (Signed)
TOWEL SLIDES COMPLETE FOR 1-3 MINUTES, 3-5 TIMES PER DAY  SHOULDER: Flexion On Table   Place hands on table, elbows straight. Move hips away from body. Press hands down into table. Hold ___ seconds. ___ reps per set, ___ sets per day, ___ days per week  Abduction (Passive)   With arm out to side, resting on table, lower head toward arm, keeping trunk away from table. Hold ____ seconds. Repeat ____ times. Do ____ sessions per day.  Copyright  VHI. All rights reserved.     Internal Rotation (Assistive)   Seated with elbow bent at right angle and held against side, slide arm on table surface in an inward arc. Repeat ____ times. Do ____ sessions per day. Activity: Use this motion to brush crumbs off the table.  Copyright  VHI. All rights reserved.    COMPLETE PENDULUM EXERCISES FOR 30 SECONDS TO A MINUTE EACH, 3-5 TIMES PER DAY. ROM: Pendulum (Side-to-Side)    http://orth.exer.us/792   Copyright  VHI. All rights reserved.  Pendulum Forward/Back   Bend forward 90 at waist, using table for support. Rock body forward and back to swing arm. Repeat ____ times. Do ____ sessions per day.   AROM: Wrist Extension   With right palm down, bend wrist up. Repeat 10____ times per set. Do ____ sets per session. Do __3__ sessions per day.  Copyright  VHI. All rights reserved.   AROM: Wrist Flexion   With right palm up, bend wrist up. Repeat ___10_ times per set. Do ____ sets per session. Do __3__ sessions per day.  Copyright  VHI. All rights reserved.   AROM: Forearm Pronation / Supination   With right arm in handshake position, slowly rotate palm down until stretch is felt. Relax. Then rotate palm up until stretch is felt. Repeat __10__ times per set. Do ____ sets per session. Do __3__ sessions per day.   ELBOW FLEXION EXTENSION  Bend your elbow upwards as shown and then lower to a straighten position. Complete 10 times. 3 session a day.

## 2018-08-05 ENCOUNTER — Encounter (HOSPITAL_COMMUNITY): Payer: Self-pay | Admitting: Occupational Therapy

## 2018-08-05 ENCOUNTER — Ambulatory Visit (HOSPITAL_COMMUNITY): Payer: BLUE CROSS/BLUE SHIELD | Attending: Specialist | Admitting: Occupational Therapy

## 2018-08-05 DIAGNOSIS — M25511 Pain in right shoulder: Secondary | ICD-10-CM

## 2018-08-05 DIAGNOSIS — R29898 Other symptoms and signs involving the musculoskeletal system: Secondary | ICD-10-CM

## 2018-08-05 DIAGNOSIS — M25611 Stiffness of right shoulder, not elsewhere classified: Secondary | ICD-10-CM | POA: Diagnosis not present

## 2018-08-05 NOTE — Therapy (Signed)
[facility] [address] [place], [place], [address] Phone: [phone]   Fax:  [phone]  Occupational Therapy Treatment  Patient Details  Name: Megan Schaefer MRN: [id number] Date of Birth: Jan 02, 1956 Referring Provider (OT): [name], MD   Encounter Date: 08/05/2018  OT End of Session - 08/05/18 1551    Visit Number  2    Number of Visits  24    Date for OT Re-Evaluation  10/21/18   mini reasses: 08/26/18   Authorization Type  BCBS     Authorization Time Period  covered 100%. 30 visits for OT/PT/Chiro. 0 used.     Authorization - Visit Number  2    Authorization - Number of Visits  30    OT Start Time  6195    OT Stop Time  1558    OT Time Calculation (min)  42 min    Activity Tolerance  Patient tolerated treatment well    Behavior During Therapy  WFL for tasks assessed/performed       Past Medical History:  Diagnosis Date  . Anxiety   . Arthritis   . Depression   . Eczema   . Hypertension     Past Surgical History:  Procedure Laterality Date  . BIOPSY  04/21/2018   Procedure: BIOPSY;  Surgeon: [name], MD;  [facility];  Service: Endoscopy;;  . COLONOSCOPY N/A 10/20/2012   Procedure: COLONOSCOPY;  Surgeon: [name], MD;  [facility];  Service: Endoscopy;  Laterality: N/A;  930  . COLONOSCOPY N/A 04/21/2018   Procedure: COLONOSCOPY;  Surgeon: [name], MD;  [facility];  Service: Endoscopy;  Laterality: N/A;  930  . FINGER GANGLION CYST EXCISION Right    pinky  . KNEE SURGERY Right     There were no vitals filed for this visit.  Subjective Assessment - 08/05/18 1514    Subjective   S: I find myself taking this sling off at night.     Currently in Pain?  Yes    Pain Score  3     Pain Location  Shoulder    Pain Orientation  Right    Pain Descriptors / Indicators  Aching    Pain Type  Acute pain    Pain Radiating Towards  to forearm    Pain Onset  1  to 4 weeks ago    Pain Frequency  Intermittent    Aggravating Factors   use of arm, movement    Pain Relieving Factors  rest, ice    Effect of Pain on Daily Activities  unable to use RUE for any ADLs at this time    Multiple Pain Sites  No         [facility] OT Assessment - 08/05/18 1514      Assessment   Medical Diagnosis  right shoulder RTC repair      Precautions   Precautions  Shoulder    Type of Shoulder Precautions  2 weeks of P/ROM. (until 08/09/18) D/C sling on 08/09/18. (3/9-4/6): AA/ROM and A/ROM. (4/6-5/4): progressive strengthening    Shoulder Interventions  Shoulder sling/immobilizer;At all times               OT Treatments/Exercises (OP) - 08/05/18 1520      Exercises   Exercises  Shoulder      Shoulder Exercises: Supine   Protraction  PROM;10 reps    Horizontal ABduction  PROM;10 reps  External Rotation  PROM;10 reps    Internal Rotation  PROM;10 reps    Flexion  PROM;10 reps    ABduction  PROM;10 reps      Shoulder Exercises: Seated   Elevation  AROM;10 reps    Extension  AROM;10 reps    Row  AROM;10 reps    Other Seated Exercises  scapular depression, A/ROM, 10X      Shoulder Exercises: Therapy Ball   Flexion  10 reps    ABduction  10 reps      Shoulder Exercises: Isometric Strengthening   Flexion  Supine;3X5"    Extension  Supine;3X5"    External Rotation  Supine;3X5"    Internal Rotation  Supine;3X5"    ABduction  Supine;3X5"    ADduction  Supine;3X5"      Manual Therapy   Manual Therapy  Myofascial release    Manual therapy comments  completed separately from therapeutic exercises    Myofascial Release  myofascial release to right upper arm, trapezius, and scapularis regions to decrease pain and fascial restrictions and increase joint range of motion               OT Short Term Goals - 08/05/18 1544      OT SHORT TERM GOAL #1   Title  Patient will be educated and independent with her HEP in order to faciliate her progress in  therapy and allow her to return to using her RUE as her dominant extremity for all daily tasks.     Time  6    Period  Weeks    Status  On-going    Target Date  09/09/18      OT SHORT TERM GOAL #2   Title  Patient will increase her RUE P/ROM to WNL in order to increase her ability to complete upper body dressing tasks and grooming tasks with less difficulty.     Time  6    Period  Weeks    Status  On-going      OT SHORT TERM GOAL #3   Title  Patient will increase RUE strength to 3+/5 in order to complete lightweight household lifting tasks at waist level.     Time  6    Period  Weeks    Status  On-going      OT SHORT TERM GOAL #4   Title  Patient will decrease fascial restrictions to trace amount in her RUE in order to increase the functional mobility needed to complete reaching tasks at shoulder level.     Time  6    Period  Weeks    Status  On-going      OT SHORT TERM GOAL #5   Title  Patient will report a decrease in pain when completing waist level daily tasks with her RUE of approximately 4/10.     Time  6    Period  Weeks    Status  On-going        OT Long Term Goals - 08/05/18 1544      OT LONG TERM GOAL #1   Title  Patient will increase RUE A/ROM to [facility] in order to complete functional reaching tasks above shoulder or overhead.     Time  12    Period  Weeks    Status  On-going      OT LONG TERM GOAL #2   Title  Patient will increase RUE strength to 5/5 in order to return to normal yardwork activity and  exercise.     Time  12    Period  Weeks    Status  On-going      OT LONG TERM GOAL #3   Title  Patient will report a decrease in pain of approximately 2/10 while completing desired daily tasks.     Time  12    Period  Weeks    Status  On-going            Plan - 08/05/18 1544    Clinical Impression Statement  A: Initiated myofascial release, P/ROM, isometrics, scapular A/ROM, and therapy ball exercises today. Pt is most limited with er, all other  motions are [facility]. Verbal cuing for form and technique with exercises. Pt reporting discomfort sleeping, educated on experimenting with various pillow positions to increase comfort and ability to sleep.     Body Structure / Function / Physical Skills  Pain;Fascial restriction;ROM;Strength;UE functional use    Plan  P: continue with P/ROM working to improve tolerance to er.        Patient will benefit from skilled therapeutic intervention in order to improve the following deficits and impairments:  Body Structure / Function / Physical Skills  Visit Diagnosis: Other symptoms and signs involving the musculoskeletal system  Acute pain of right shoulder  Stiffness of right shoulder, not elsewhere classified    Problem List Patient Active Problem List   Diagnosis Date Noted  . Special screening for malignant neoplasms, colon 11/20/2017  . Hypertension 07/03/2017  . Social anxiety disorder 07/03/2017  . Tobacco abuse 07/03/2017  . Tubular adenoma 07/03/2017   Guadelupe Sabin, OTR/L  [phone] 08/05/2018, 3:58 PM  [facility] [place], [place], [address] Phone: [phone]   Fax:  [phone]  Name: Megan Schaefer MRN: [id number] Date of Birth: January 19, 1956

## 2018-08-06 ENCOUNTER — Encounter (HOSPITAL_COMMUNITY): Payer: Self-pay | Admitting: Occupational Therapy

## 2018-08-06 ENCOUNTER — Ambulatory Visit (HOSPITAL_COMMUNITY): Payer: BLUE CROSS/BLUE SHIELD | Admitting: Occupational Therapy

## 2018-08-06 DIAGNOSIS — M25511 Pain in right shoulder: Secondary | ICD-10-CM | POA: Diagnosis not present

## 2018-08-06 DIAGNOSIS — M25611 Stiffness of right shoulder, not elsewhere classified: Secondary | ICD-10-CM

## 2018-08-06 DIAGNOSIS — R29898 Other symptoms and signs involving the musculoskeletal system: Secondary | ICD-10-CM | POA: Diagnosis not present

## 2018-08-06 NOTE — Therapy (Signed)
[facility] [address] [place], [place], [address] Phone: [phone]   Fax:  [phone]  Occupational Therapy Treatment  Patient Details  Name: Megan Schaefer MRN: [id number] Date of Birth: 02-Jul-1955 Referring Provider (OT): [name], MD   Encounter Date: 08/06/2018  OT End of Session - 08/06/18 1102    Visit Number  3    Number of Visits  24    Date for OT Re-Evaluation  10/21/18   mini reasses: 08/26/18   Authorization Type  [employer]     Authorization Time Period  covered 100%. 30 visits for OT/PT/Chiro. 0 used.     Authorization - Visit Number  3    Authorization - Number of Visits  30    OT Start Time  1030    OT Stop Time  1110    OT Time Calculation (min)  40 min    Activity Tolerance  Patient tolerated treatment well    Behavior During Therapy  WFL for tasks assessed/performed       Past Medical History:  Diagnosis Date  . Anxiety   . Arthritis   . Depression   . Eczema   . Hypertension     Past Surgical History:  Procedure Laterality Date  . BIOPSY  04/21/2018   Procedure: BIOPSY;  Surgeon: [name], MD;  [facility];  Service: Endoscopy;;  . COLONOSCOPY N/A 10/20/2012   Procedure: COLONOSCOPY;  Surgeon: [name], MD;  [facility];  Service: Endoscopy;  Laterality: N/A;  930  . COLONOSCOPY N/A 04/21/2018   Procedure: COLONOSCOPY;  Surgeon: [name], MD;  [facility];  Service: Endoscopy;  Laterality: N/A;  930  . FINGER GANGLION CYST EXCISION Right    pinky  . KNEE SURGERY Right     There were no vitals filed for this visit.  Subjective Assessment - 08/06/18 1029    Subjective   S: I don't know if I'm ready to wean from my sling.     Currently in Pain?  Yes    Pain Score  1     Pain Location  Shoulder    Pain Orientation  Right    Pain Descriptors / Indicators  Aching    Pain Type  Acute pain    Pain Radiating Towards  to forearm    Pain Onset   1 to 4 weeks ago    Pain Frequency  Intermittent    Aggravating Factors   use of arm, movement    Pain Relieving Factors  rest, ice    Effect of Pain on Daily Activities  unable to use RUE for any ADLs    Multiple Pain Sites  No         [facility] OT Assessment - 08/06/18 1029      Assessment   Medical Diagnosis  right shoulder RTC repair      Precautions   Precautions  Shoulder    Type of Shoulder Precautions  2 weeks of P/ROM. (until 08/09/18) D/C sling on 08/09/18. (3/9-4/6): AA/ROM and A/ROM. (4/6-5/4): progressive strengthening    Shoulder Interventions  Shoulder sling/immobilizer;At all times               OT Treatments/Exercises (OP) - 08/06/18 1029      Exercises   Exercises  Shoulder      Shoulder Exercises: Supine   Protraction  PROM;10 reps    Horizontal ABduction  PROM;10 reps  External Rotation  PROM;10 reps    Internal Rotation  PROM;10 reps    Flexion  PROM;10 reps    ABduction  PROM;10 reps      Shoulder Exercises: Seated   Elevation  AROM;10 reps    Extension  AROM;10 reps    Row  AROM;10 reps    Other Seated Exercises  scapular depression, A/ROM, 10X      Shoulder Exercises: Therapy Ball   Flexion  10 reps    ABduction  10 reps      Shoulder Exercises: Isometric Strengthening   Flexion  Supine;3X5"    Extension  Supine;3X5"    External Rotation  Supine;3X5"    Internal Rotation  Supine;3X5"    ABduction  Supine;3X5"    ADduction  Supine;3X5"      Manual Therapy   Manual Therapy  Myofascial release    Manual therapy comments  completed separately from therapeutic exercises    Myofascial Release  myofascial release to right upper arm, trapezius, and scapularis regions to decrease pain and fascial restrictions and increase joint range of motion               OT Short Term Goals - 08/05/18 1544      OT SHORT TERM GOAL #1   Title  Patient will be educated and independent with her HEP in order to faciliate her progress in therapy  and allow her to return to using her RUE as her dominant extremity for all daily tasks.     Time  6    Period  Weeks    Status  On-going    Target Date  09/09/18      OT SHORT TERM GOAL #2   Title  Patient will increase her RUE P/ROM to WNL in order to increase her ability to complete upper body dressing tasks and grooming tasks with less difficulty.     Time  6    Period  Weeks    Status  On-going      OT SHORT TERM GOAL #3   Title  Patient will increase RUE strength to 3+/5 in order to complete lightweight household lifting tasks at waist level.     Time  6    Period  Weeks    Status  On-going      OT SHORT TERM GOAL #4   Title  Patient will decrease fascial restrictions to trace amount in her RUE in order to increase the functional mobility needed to complete reaching tasks at shoulder level.     Time  6    Period  Weeks    Status  On-going      OT SHORT TERM GOAL #5   Title  Patient will report a decrease in pain when completing waist level daily tasks with her RUE of approximately 4/10.     Time  6    Period  Weeks    Status  On-going        OT Long Term Goals - 08/05/18 1544      OT LONG TERM GOAL #1   Title  Patient will increase RUE A/ROM to [facility] in order to complete functional reaching tasks above shoulder or overhead.     Time  12    Period  Weeks    Status  On-going      OT LONG TERM GOAL #2   Title  Patient will increase RUE strength to 5/5 in order to return to normal yardwork activity and  exercise.     Time  12    Period  Weeks    Status  On-going      OT LONG TERM GOAL #3   Title  Patient will report a decrease in pain of approximately 2/10 while completing desired daily tasks.     Time  12    Period  Weeks    Status  On-going            Plan - 08/06/18 1057    Clinical Impression Statement  A: Completed manual therapy to address fascial restrictions in right upper arm and trapezius regions. Continued with P/ROM working to improve er, able to  achieve approximately 20 degrees today. Verbal cuing for form and technique. Adjusted pt's sling to fit without abduction pillow as pt wants to wean off before discontinuing sling next week.     Body Structure / Function / Physical Skills  Pain;Fascial restriction;ROM;Strength;UE functional use    Plan  P: continue with P/ROM, increase isometrics to 5x5", add low level thumb tacks.       Patient will benefit from skilled therapeutic intervention in order to improve the following deficits and impairments:  Body Structure / Function / Physical Skills  Visit Diagnosis: Other symptoms and signs involving the musculoskeletal system  Acute pain of right shoulder  Stiffness of right shoulder, not elsewhere classified    Problem List Patient Active Problem List   Diagnosis Date Noted  . Special screening for malignant neoplasms, colon 11/20/2017  . Hypertension 07/03/2017  . Social anxiety disorder 07/03/2017  . Tobacco abuse 07/03/2017  . Tubular adenoma 07/03/2017   Guadelupe Sabin, OTR/L  [phone] 08/06/2018, 11:10 AM  [place], [place], [address] Phone: [phone]   Fax:  [phone]  Name: Megan Schaefer MRN: [id number] Date of Birth: February 17, 1956

## 2018-08-10 ENCOUNTER — Ambulatory Visit (HOSPITAL_COMMUNITY): Payer: BLUE CROSS/BLUE SHIELD | Admitting: Occupational Therapy

## 2018-08-10 ENCOUNTER — Encounter (HOSPITAL_COMMUNITY): Payer: Self-pay | Admitting: Occupational Therapy

## 2018-08-10 DIAGNOSIS — M25611 Stiffness of right shoulder, not elsewhere classified: Secondary | ICD-10-CM | POA: Diagnosis not present

## 2018-08-10 DIAGNOSIS — M25511 Pain in right shoulder: Secondary | ICD-10-CM

## 2018-08-10 DIAGNOSIS — R29898 Other symptoms and signs involving the musculoskeletal system: Secondary | ICD-10-CM | POA: Diagnosis not present

## 2018-08-10 NOTE — Therapy (Signed)
[facility] [address] [place], [place], [address] Phone: [phone]   Fax:  [phone]  Occupational Therapy Treatment  Patient Details  Name: Megan Schaefer MRN: [id number] Date of Birth: 08-09-1955 Referring Provider (OT): [name], MD   Encounter Date: 08/10/2018  OT End of Session - 08/10/18 1113    Visit Number  4    Number of Visits  24    Date for OT Re-Evaluation  10/21/18   mini reasses: 08/26/18   Authorization Type  [employer]     Authorization Time Period  covered 100%. 30 visits for OT/PT/Chiro. 0 used.     Authorization - Visit Number  4    Authorization - Number of Visits  30    OT Start Time  6659    OT Stop Time  1115    OT Time Calculation (min)  43 min    Activity Tolerance  Patient tolerated treatment well    Behavior During Therapy  WFL for tasks assessed/performed       Past Medical History:  Diagnosis Date  . Anxiety   . Arthritis   . Depression   . Eczema   . Hypertension     Past Surgical History:  Procedure Laterality Date  . BIOPSY  04/21/2018   Procedure: BIOPSY;  Surgeon: [name], MD;  [facility];  Service: Endoscopy;;  . COLONOSCOPY N/A 10/20/2012   Procedure: COLONOSCOPY;  Surgeon: [name], MD;  [facility];  Service: Endoscopy;  Laterality: N/A;  930  . COLONOSCOPY N/A 04/21/2018   Procedure: COLONOSCOPY;  Surgeon: [name], MD;  [facility];  Service: Endoscopy;  Laterality: N/A;  930  . FINGER GANGLION CYST EXCISION Right    pinky  . KNEE SURGERY Right     There were no vitals filed for this visit.  Subjective Assessment - 08/10/18 1028    Subjective   S: I made it without the pillow all weekend.     Currently in Pain?  Yes    Pain Score  4     Pain Location  Shoulder    Pain Orientation  Right    Pain Descriptors / Indicators  Aching    Pain Type  Acute pain    Pain Radiating Towards  to forearm    Pain Onset  1 to  4 weeks ago    Pain Frequency  Intermittent    Aggravating Factors   use of arm, movement    Pain Relieving Factors  rest, ice    Effect of Pain on Daily Activities  unable to use RUE for any ADLs    Multiple Pain Sites  No         [facility] OT Assessment - 08/10/18 1028      Assessment   Medical Diagnosis  right shoulder RTC repair      Precautions   Precautions  Shoulder    Type of Shoulder Precautions  2 weeks of P/ROM. (until 08/09/18) D/C sling on 08/09/18. (3/9-4/6): AA/ROM and A/ROM. (4/6-5/4): progressive strengthening    Shoulder Interventions  Shoulder sling/immobilizer;At all times               OT Treatments/Exercises (OP) - 08/10/18 1035      Exercises   Exercises  Shoulder      Shoulder Exercises: Supine   Protraction  PROM;5 reps;AAROM;10 reps    Horizontal ABduction  PROM;5 reps;AAROM;10 reps  External Rotation  PROM;5 reps;AAROM;10 reps    Internal Rotation  PROM;5 reps;AAROM;10 reps    Flexion  PROM;5 reps;AAROM;10 reps    ABduction  PROM;5 reps;AAROM;10 reps      Shoulder Exercises: Standing   Protraction  AAROM;10 reps    Horizontal ABduction  AAROM;10 reps    External Rotation  AAROM;10 reps    Internal Rotation  AAROM;10 reps    Flexion  AAROM;10 reps    ABduction  AAROM;10 reps      Manual Therapy   Manual Therapy  Myofascial release    Manual therapy comments  completed separately from therapeutic exercises    Myofascial Release  myofascial release to right upper arm, trapezius, and scapularis regions to decrease pain and fascial restrictions and increase joint range of motion               OT Short Term Goals - 08/05/18 1544      OT SHORT TERM GOAL #1   Title  Patient will be educated and independent with her HEP in order to faciliate her progress in therapy and allow her to return to using her RUE as her dominant extremity for all daily tasks.     Time  6    Period  Weeks    Status  On-going    Target Date  09/09/18       OT SHORT TERM GOAL #2   Title  Patient will increase her RUE P/ROM to WNL in order to increase her ability to complete upper body dressing tasks and grooming tasks with less difficulty.     Time  6    Period  Weeks    Status  On-going      OT SHORT TERM GOAL #3   Title  Patient will increase RUE strength to 3+/5 in order to complete lightweight household lifting tasks at waist level.     Time  6    Period  Weeks    Status  On-going      OT SHORT TERM GOAL #4   Title  Patient will decrease fascial restrictions to trace amount in her RUE in order to increase the functional mobility needed to complete reaching tasks at shoulder level.     Time  6    Period  Weeks    Status  On-going      OT SHORT TERM GOAL #5   Title  Patient will report a decrease in pain when completing waist level daily tasks with her RUE of approximately 4/10.     Time  6    Period  Weeks    Status  On-going        OT Long Term Goals - 08/05/18 1544      OT LONG TERM GOAL #1   Title  Patient will increase RUE A/ROM to [facility] in order to complete functional reaching tasks above shoulder or overhead.     Time  12    Period  Weeks    Status  On-going      OT LONG TERM GOAL #2   Title  Patient will increase RUE strength to 5/5 in order to return to normal yardwork activity and exercise.     Time  12    Period  Weeks    Status  On-going      OT LONG TERM GOAL #3   Title  Patient will report a decrease in pain of approximately 2/10 while completing desired daily tasks.  Time  12    Period  Weeks    Status  On-going            Plan - 08/10/18 1057    Clinical Impression Statement  A: Manual therapy completed to right upper arm and upper trapezius to address fascial restrictions. Pt able to tolerate passive stretching to WNL with exception of er which increased to approximately 30 degrees today. Progressed to AA/ROM per protocol, increased time for rest breaks. Verbal cuing for form and technique.      Body Structure / Function / Physical Skills  Pain;Fascial restriction;ROM;Strength;UE functional use    Plan  P: update HEP for AA/ROM, add thumb tacks and pulleys       Patient will benefit from skilled therapeutic intervention in order to improve the following deficits and impairments:  Body Structure / Function / Physical Skills  Visit Diagnosis: Other symptoms and signs involving the musculoskeletal system  Acute pain of right shoulder  Stiffness of right shoulder, not elsewhere classified    Problem List Patient Active Problem List   Diagnosis Date Noted  . Special screening for malignant neoplasms, colon 11/20/2017  . Hypertension 07/03/2017  . Social anxiety disorder 07/03/2017  . Tobacco abuse 07/03/2017  . Tubular adenoma 07/03/2017   Guadelupe Sabin, OTR/L  [phone] 08/10/2018, 11:16 AM  [facility] [place], [place], [address] Phone: [phone]   Fax:  [phone]  Name: Megan Schaefer MRN: [id number] Date of Birth: 07/02/1955

## 2018-08-12 ENCOUNTER — Encounter (HOSPITAL_COMMUNITY): Payer: Self-pay

## 2018-08-12 ENCOUNTER — Other Ambulatory Visit: Payer: Self-pay

## 2018-08-12 ENCOUNTER — Ambulatory Visit (HOSPITAL_COMMUNITY): Payer: BLUE CROSS/BLUE SHIELD

## 2018-08-12 DIAGNOSIS — R29898 Other symptoms and signs involving the musculoskeletal system: Secondary | ICD-10-CM | POA: Diagnosis not present

## 2018-08-12 DIAGNOSIS — M25611 Stiffness of right shoulder, not elsewhere classified: Secondary | ICD-10-CM

## 2018-08-12 DIAGNOSIS — M25511 Pain in right shoulder: Secondary | ICD-10-CM

## 2018-08-12 NOTE — Patient Instructions (Signed)

## 2018-08-12 NOTE — Therapy (Signed)
[facility] [place], [place], [address] Phone: [phone]   Fax:  [phone]  Occupational Therapy Treatment  Patient Details  Name: Megan Schaefer MRN: [id number] Date of Birth: [birth year] Referring Provider (OT): [name], MD   Encounter Date: 08/12/2018  OT End of Session - 08/12/18 1055    Visit Number  5    Number of Visits  24    Date for OT Re-Evaluation  10/21/18   mini reasses: 08/26/18   Authorization Type  [employer]     Authorization Time Period  covered 100%. 30 visits for OT/PT/Chiro. 0 used.     Authorization - Visit Number  5    Authorization - Number of Visits  30    OT Start Time  [phone]    OT Stop Time  1030    OT Time Calculation (min)  44 min    Activity Tolerance  Patient tolerated treatment well    Behavior During Therapy  WFL for tasks assessed/performed       Past Medical History:  Diagnosis Date  . Anxiety   . Arthritis   . Depression   . Eczema   . Hypertension     Past Surgical History:  Procedure Laterality Date  . BIOPSY  04/21/2018   Procedure: BIOPSY;  Surgeon: [name], MD;  [facility];  Service: Endoscopy;;  . COLONOSCOPY N/A 10/20/2012   Procedure: COLONOSCOPY;  Surgeon: [name], MD;  [facility];  Service: Endoscopy;  Laterality: N/A;  930  . COLONOSCOPY N/A 04/21/2018   Procedure: COLONOSCOPY;  Surgeon: [name], MD;  [facility];  Service: Endoscopy;  Laterality: N/A;  930  . FINGER GANGLION CYST EXCISION Right    pinky  . KNEE SURGERY Right     There were no vitals filed for this visit.  Subjective Assessment - 08/12/18 1052    Subjective   S: I'm trying to wean from the sling so i didn't wear it here.     Currently in Pain?  Yes    Pain Score  5     Pain Location  Shoulder    Pain Orientation  Right    Pain Descriptors / Indicators  Aching;Sore    Pain Type  Acute pain         [facility] OT Assessment -  08/12/18 1054      Assessment   Medical Diagnosis  right shoulder RTC repair      Precautions   Precautions  Shoulder    Type of Shoulder Precautions  2 weeks of P/ROM. (until 08/09/18) D/C sling on 08/09/18. (3/9-4/6): AA/ROM and A/ROM. (4/6-5/4): progressive strengthening    Shoulder Interventions  Shoulder sling/immobilizer;At all times               OT Treatments/Exercises (OP) - 08/12/18 1054      Exercises   Exercises  Shoulder      Shoulder Exercises: Supine   Protraction  PROM;5 reps;AAROM;10 reps    Horizontal ABduction  PROM;5 reps;AAROM;10 reps    External Rotation  PROM;5 reps;AAROM;10 reps    Internal Rotation  PROM;5 reps;AAROM;10 reps    Flexion  PROM;5 reps;AAROM;10 reps    ABduction  PROM;5 reps;AAROM;10 reps      Shoulder Exercises: Standing   Protraction  AAROM;10 reps    Horizontal ABduction  AAROM;10 reps    External Rotation  AAROM;10 reps    Internal Rotation  AAROM;10 reps    Flexion  AAROM;10 reps    ABduction  AAROM;10 reps      Manual Therapy   Manual Therapy  Myofascial release    Manual therapy comments  completed separately from therapeutic exercises    Myofascial Release  myofascial release to right upper arm, trapezius, and scapularis regions to decrease pain and fascial restrictions and increase joint range of motion             OT Education - 08/12/18 1054    Education Details  Pt is to stop previous exercises. Provided with AA/ROM shoulder exercises - standing.     Person(s) Educated  Patient    Methods  Demonstration;Explanation;Handout;Verbal cues    Comprehension  Verbalized understanding;Returned demonstration       OT Short Term Goals - 08/05/18 1544      OT SHORT TERM GOAL #1   Title  Patient will be educated and independent with her HEP in order to faciliate her progress in therapy and allow her to return to using her RUE as her dominant extremity for all daily tasks.     Time  6    Period  Weeks    Status   On-going    Target Date  09/09/18      OT SHORT TERM GOAL #2   Title  Patient will increase her RUE P/ROM to WNL in order to increase her ability to complete upper body dressing tasks and grooming tasks with less difficulty.     Time  6    Period  Weeks    Status  On-going      OT SHORT TERM GOAL #3   Title  Patient will increase RUE strength to 3+/5 in order to complete lightweight household lifting tasks at waist level.     Time  6    Period  Weeks    Status  On-going      OT SHORT TERM GOAL #4   Title  Patient will decrease fascial restrictions to trace amount in her RUE in order to increase the functional mobility needed to complete reaching tasks at shoulder level.     Time  6    Period  Weeks    Status  On-going      OT SHORT TERM GOAL #5   Title  Patient will report a decrease in pain when completing waist level daily tasks with her RUE of approximately 4/10.     Time  6    Period  Weeks    Status  On-going        OT Long Term Goals - 08/05/18 1544      OT LONG TERM GOAL #1   Title  Patient will increase RUE A/ROM to [facility] in order to complete functional reaching tasks above shoulder or overhead.     Time  12    Period  Weeks    Status  On-going      OT LONG TERM GOAL #2   Title  Patient will increase RUE strength to 5/5 in order to return to normal yardwork activity and exercise.     Time  12    Period  Weeks    Status  On-going      OT LONG TERM GOAL #3   Title  Patient will report a decrease in pain of approximately 2/10 while completing desired daily tasks.     Time  12    Period  Weeks    Status  On-going            Plan - 08/12/18 1055    Clinical Impression Statement  A: Patient complete AA/ROM supine and standing with VC for form and technique. Updated HEP to include AA/ROM. Pt presents with a moderate amount of fascial restrictions in right upper arm and upper trapezious region with manual technique completed to address.     Body Structure /  Function / Physical Skills  Pain;Fascial restriction;ROM;Strength;UE functional use    Plan  P: Follow up on HEP. Add thumb tacks and pulleys    Consulted and Agree with Plan of Care  Patient       Patient will benefit from skilled therapeutic intervention in order to improve the following deficits and impairments:  Body Structure / Function / Physical Skills  Visit Diagnosis: Other symptoms and signs involving the musculoskeletal system  Stiffness of right shoulder, not elsewhere classified  Acute pain of right shoulder    Problem List Patient Active Problem List   Diagnosis Date Noted  . Special screening for malignant neoplasms, colon 11/20/2017  . Hypertension 07/03/2017  . Social anxiety disorder 07/03/2017  . Tobacco abuse 07/03/2017  . Tubular adenoma 07/03/2017   Ailene Ravel, OTR/L,CBIS  [phone]  08/12/2018, 10:58 AM  [facility] [address] [place], [place], [address] Phone: [phone]   Fax:  [phone]  Name: Megan Schaefer MRN: [id number] Date of Birth: [birth year]

## 2018-08-17 ENCOUNTER — Other Ambulatory Visit: Payer: Self-pay

## 2018-08-17 ENCOUNTER — Ambulatory Visit (HOSPITAL_COMMUNITY): Payer: BLUE CROSS/BLUE SHIELD

## 2018-08-17 ENCOUNTER — Encounter (HOSPITAL_COMMUNITY): Payer: Self-pay

## 2018-08-17 DIAGNOSIS — M25511 Pain in right shoulder: Secondary | ICD-10-CM

## 2018-08-17 DIAGNOSIS — M25611 Stiffness of right shoulder, not elsewhere classified: Secondary | ICD-10-CM | POA: Diagnosis not present

## 2018-08-17 DIAGNOSIS — R29898 Other symptoms and signs involving the musculoskeletal system: Secondary | ICD-10-CM | POA: Diagnosis not present

## 2018-08-17 NOTE — Therapy (Signed)
[facility] [address] [place], [place], [address] Phone: [phone]   Fax:  [phone]  Occupational Therapy Treatment  Patient Details  Name: Megan Schaefer MRN: [id number] Date of Birth: 08-20-1955 Referring Provider (OT): [name], MD   Encounter Date: 08/17/2018  OT End of Session - 08/17/18 1052    Visit Number  6    Number of Visits  24    Date for OT Re-Evaluation  10/21/18   mini reasses: 08/26/18   Authorization Type  BCBS     Authorization Time Period  covered 100%. 30 visits for OT/PT/Chiro. 0 used.     Authorization - Visit Number  6    Authorization - Number of Visits  30    OT Start Time  6962    OT Stop Time  1112    OT Time Calculation (min)  44 min    Activity Tolerance  Patient tolerated treatment well    Behavior During Therapy  WFL for tasks assessed/performed       Past Medical History:  Diagnosis Date  . Anxiety   . Arthritis   . Depression   . Eczema   . Hypertension     Past Surgical History:  Procedure Laterality Date  . BIOPSY  04/21/2018   Procedure: BIOPSY;  Surgeon: [name], MD;  [facility];  Service: Endoscopy;;  . COLONOSCOPY N/A 10/20/2012   Procedure: COLONOSCOPY;  Surgeon: [name], MD;  [facility];  Service: Endoscopy;  Laterality: N/A;  930  . COLONOSCOPY N/A 04/21/2018   Procedure: COLONOSCOPY;  Surgeon: [name], MD;  [facility];  Service: Endoscopy;  Laterality: N/A;  930  . FINGER GANGLION CYST EXCISION Right    pinky  . KNEE SURGERY Right     There were no vitals filed for this visit.  Subjective Assessment - 08/17/18 1042    Subjective   S: I'm using the vacuum extension to do my exercises.    Currently in Pain?  Yes    Pain Score  6     Pain Location  Shoulder    Pain Orientation  Right    Pain Descriptors / Indicators  Aching;Sore    Pain Type  Acute pain    Pain Radiating Towards  to forearm    Pain Onset  1 to 4 weeks ago    Pain Frequency  Intermittent    Aggravating Factors   use of arm, moverment    Pain Relieving Factors  rest, ice    Effect of Pain on Daily Activities  severe effect    Multiple Pain Sites  No         [facility] OT Assessment - 08/17/18 1044      Assessment   Medical Diagnosis  right shoulder RTC repair      Precautions   Precautions  Shoulder    Type of Shoulder Precautions  2 weeks of P/ROM. (until 08/09/18) D/C sling on 08/09/18. (3/9-4/6): AA/ROM and A/ROM. (4/6-5/4): progressive strengthening    Shoulder Interventions  Shoulder sling/immobilizer;At all times               OT Treatments/Exercises (OP) - 08/17/18 1044      Exercises   Exercises  Shoulder      Shoulder Exercises: Supine   Protraction  PROM;5 reps;AAROM;12 reps    Horizontal ABduction  PROM;5 reps;AAROM;12 reps    External Rotation  PROM;5 reps;AAROM;12 reps  Internal Rotation  PROM;5 reps;AAROM;12 reps    Flexion  PROM;5 reps;AAROM;12 reps    ABduction  PROM;5 reps;AAROM;12 reps      Shoulder Exercises: Standing   Protraction  AAROM;12 reps    Horizontal ABduction  AAROM;12 reps    External Rotation  AAROM;12 reps    Internal Rotation  AAROM;12 reps    Flexion  AAROM;12 reps    ABduction  AAROM;12 reps      Shoulder Exercises: Pulleys   Flexion  1 minute    ABduction  1 minute      Shoulder Exercises: ROM/Strengthening   Proximal Shoulder Strengthening, Supine  10X A/ROM no rest breaks      Manual Therapy   Manual Therapy  Myofascial release    Manual therapy comments  completed separately from therapeutic exercises    Myofascial Release  myofascial release to right upper arm, trapezius, and scapularis regions to decrease pain and fascial restrictions and increase joint range of motion               OT Short Term Goals - 08/05/18 1544      OT SHORT TERM GOAL #1   Title  Patient will be educated and independent with her HEP in order to faciliate her  progress in therapy and allow her to return to using her RUE as her dominant extremity for all daily tasks.     Time  6    Period  Weeks    Status  On-going    Target Date  09/09/18      OT SHORT TERM GOAL #2   Title  Patient will increase her RUE P/ROM to WNL in order to increase her ability to complete upper body dressing tasks and grooming tasks with less difficulty.     Time  6    Period  Weeks    Status  On-going      OT SHORT TERM GOAL #3   Title  Patient will increase RUE strength to 3+/5 in order to complete lightweight household lifting tasks at waist level.     Time  6    Period  Weeks    Status  On-going      OT SHORT TERM GOAL #4   Title  Patient will decrease fascial restrictions to trace amount in her RUE in order to increase the functional mobility needed to complete reaching tasks at shoulder level.     Time  6    Period  Weeks    Status  On-going      OT SHORT TERM GOAL #5   Title  Patient will report a decrease in pain when completing waist level daily tasks with her RUE of approximately 4/10.     Time  6    Period  Weeks    Status  On-going        OT Long Term Goals - 08/05/18 1544      OT LONG TERM GOAL #1   Title  Patient will increase RUE A/ROM to [facility] in order to complete functional reaching tasks above shoulder or overhead.     Time  12    Period  Weeks    Status  On-going      OT LONG TERM GOAL #2   Title  Patient will increase RUE strength to 5/5 in order to return to normal yardwork activity and exercise.     Time  12    Period  Weeks    Status  On-going      OT LONG TERM GOAL #3   Title  Patient will report a decrease in pain of approximately 2/10 while completing desired daily tasks.     Time  12    Period  Weeks    Status  On-going            Plan - 08/17/18 1110    Clinical Impression Statement  A: increased repetitions for AA/ROM shoulder exercises. Added pulleys and wall wash. patient require VC for form and technique. Min  fascial restrictions noted in right upper arm region with manual technique completed to address.     Body Structure / Function / Physical Skills  Pain;Fascial restriction;ROM;Strength;UE functional use    Plan  P: Complete A/ROM supine and attempt standing.    Consulted and Agree with Plan of Care  Patient       Patient will benefit from skilled therapeutic intervention in order to improve the following deficits and impairments:  Body Structure / Function / Physical Skills  Visit Diagnosis: Stiffness of right shoulder, not elsewhere classified  Acute pain of right shoulder  Other symptoms and signs involving the musculoskeletal system    Problem List Patient Active Problem List   Diagnosis Date Noted  . Special screening for malignant neoplasms, colon 11/20/2017  . Hypertension 07/03/2017  . Social anxiety disorder 07/03/2017  . Tobacco abuse 07/03/2017  . Tubular adenoma 07/03/2017   Ailene Ravel, OTR/L,CBIS  [phone]  08/17/2018, 11:17 AM  [facility] [address] [place], [place], [address] Phone: [phone]   Fax:  [phone]  Name: TERENA BOHAN MRN: [id number] Date of Birth: Jun 27, 1955

## 2018-08-19 ENCOUNTER — Ambulatory Visit (HOSPITAL_COMMUNITY): Payer: BLUE CROSS/BLUE SHIELD

## 2018-08-19 ENCOUNTER — Other Ambulatory Visit: Payer: Self-pay

## 2018-08-19 ENCOUNTER — Encounter (HOSPITAL_COMMUNITY): Payer: Self-pay

## 2018-08-19 DIAGNOSIS — M25511 Pain in right shoulder: Secondary | ICD-10-CM

## 2018-08-19 DIAGNOSIS — R29898 Other symptoms and signs involving the musculoskeletal system: Secondary | ICD-10-CM | POA: Diagnosis not present

## 2018-08-19 DIAGNOSIS — M25611 Stiffness of right shoulder, not elsewhere classified: Secondary | ICD-10-CM | POA: Diagnosis not present

## 2018-08-19 NOTE — Patient Instructions (Signed)
Repeat all exercises 10-15 times, 1-2 times per day.  1) Shoulder Protraction    Begin with elbows by your side, slowly "punch" straight out in front of you.      2) Shoulder Flexion  Standing:         Begin with arms at your side with thumbs pointed up, slowly raise both arms up and forward towards overhead.       3) Horizontal abduction/adduction    Standing:           Begin with arms straight out in front of you, bring out to the side in at "T" shape. Keep arms straight entire time.      4) Internal & External Rotation    *No band* -Stand with elbows at the side and elbows bent 90 degrees. Move your forearms away from your body, then bring back inward toward the body.     5) Shoulder Abduction  Standing:       Begin with your arms flat on the table next to your side. Slowly move your arms out to the side so that they go overhead, in a jumping jack or snow angel movement.         

## 2018-08-19 NOTE — Therapy (Signed)
[facility] [address] [place], [place], [address] Phone: [phone]   Fax:  [phone]  Occupational Therapy Treatment  Patient Details  Name: Megan Schaefer MRN: [id number] Date of Birth: May 23, 1956 Referring Provider (OT): [name], MD   Encounter Date: 08/19/2018  OT End of Session - 08/19/18 1513    Visit Number  7    Number of Visits  24    Date for OT Re-Evaluation  10/21/18   mini reasses: 08/26/18   Authorization Type  BCBS     Authorization Time Period  covered 100%. 30 visits for OT/PT/Chiro. 0 used.     Authorization - Visit Number  7    Authorization - Number of Visits  30    OT Start Time  2423    OT Stop Time  1515    OT Time Calculation (min)  39 min    Activity Tolerance  Patient tolerated treatment well    Behavior During Therapy  WFL for tasks assessed/performed       Past Medical History:  Diagnosis Date  . Anxiety   . Arthritis   . Depression   . Eczema   . Hypertension     Past Surgical History:  Procedure Laterality Date  . BIOPSY  04/21/2018   Procedure: BIOPSY;  Surgeon: [name], MD;  [facility];  Service: Endoscopy;;  . COLONOSCOPY N/A 10/20/2012   Procedure: COLONOSCOPY;  Surgeon: [name], MD;  [facility];  Service: Endoscopy;  Laterality: N/A;  930  . COLONOSCOPY N/A 04/21/2018   Procedure: COLONOSCOPY;  Surgeon: [name], MD;  [facility];  Service: Endoscopy;  Laterality: N/A;  930  . FINGER GANGLION CYST EXCISION Right    pinky  . KNEE SURGERY Right     There were no vitals filed for this visit.  Subjective Assessment - 08/19/18 1447    Subjective   S: I put my phone in my right pants pocket and when i went to pull it out and that pain was intense.     Currently in Pain?  Yes    Pain Score  3     Pain Location  Shoulder    Pain Orientation  Right    Pain Descriptors / Indicators  Sore         [facility] OT  Assessment - 08/19/18 1448      Assessment   Medical Diagnosis  right shoulder RTC repair      Precautions   Precautions  Shoulder    Type of Shoulder Precautions  2 weeks of P/ROM. (until 08/09/18) D/C sling on 08/09/18. (3/9-4/6): AA/ROM and A/ROM. (4/6-5/4): progressive strengthening               OT Treatments/Exercises (OP) - 08/19/18 1448      Exercises   Exercises  Shoulder      Shoulder Exercises: Supine   Protraction  PROM;5 reps;AROM;10 reps    Horizontal ABduction  PROM;5 reps;AROM;10 reps    External Rotation  PROM;5 reps;AROM;10 reps    Internal Rotation  PROM;5 reps;AROM;10 reps    Flexion  PROM;5 reps;AROM;10 reps    ABduction  PROM;5 reps;AROM;10 reps      Shoulder Exercises: Standing   Protraction  AROM;10 reps    Horizontal ABduction  AROM;10 reps    External Rotation  AROM;10 reps    Internal Rotation  AROM;10 reps    Flexion  AROM;10 reps  ABduction  AROM;10 reps    Extension  Theraband;10 reps    Theraband Level (Shoulder Extension)  Level 2 (Red)    Row  Theraband;10 reps    Theraband Level (Shoulder Row)  Level 2 (Red)    Retraction  Theraband;10 reps    Theraband Level (Shoulder Retraction)  Level 2 (Red)      Shoulder Exercises: ROM/Strengthening   Wall Wash  1'    X to V Arms  10X A/ROM    Proximal Shoulder Strengthening, Seated  10X A/ROM no rest breaks      Manual Therapy   Manual Therapy  Myofascial release    Manual therapy comments  completed separately from therapeutic exercises    Myofascial Release  myofascial release to right upper arm, trapezius, and scapularis regions to decrease pain and fascial restrictions and increase joint range of motion             OT Education - 08/19/18 1509    Education Details  A/ROM shoulder exercises.     Person(s) Educated  Patient    Methods  Explanation;Demonstration;Verbal cues;Handout    Comprehension  Returned demonstration;Verbalized understanding       OT Short Term Goals -  08/05/18 1544      OT SHORT TERM GOAL #1   Title  Patient will be educated and independent with her HEP in order to faciliate her progress in therapy and allow her to return to using her RUE as her dominant extremity for all daily tasks.     Time  6    Period  Weeks    Status  On-going    Target Date  09/09/18      OT SHORT TERM GOAL #2   Title  Patient will increase her RUE P/ROM to WNL in order to increase her ability to complete upper body dressing tasks and grooming tasks with less difficulty.     Time  6    Period  Weeks    Status  On-going      OT SHORT TERM GOAL #3   Title  Patient will increase RUE strength to 3+/5 in order to complete lightweight household lifting tasks at waist level.     Time  6    Period  Weeks    Status  On-going      OT SHORT TERM GOAL #4   Title  Patient will decrease fascial restrictions to trace amount in her RUE in order to increase the functional mobility needed to complete reaching tasks at shoulder level.     Time  6    Period  Weeks    Status  On-going      OT SHORT TERM GOAL #5   Title  Patient will report a decrease in pain when completing waist level daily tasks with her RUE of approximately 4/10.     Time  6    Period  Weeks    Status  On-going        OT Long Term Goals - 08/05/18 1544      OT LONG TERM GOAL #1   Title  Patient will increase RUE A/ROM to [facility] in order to complete functional reaching tasks above shoulder or overhead.     Time  12    Period  Weeks    Status  On-going      OT LONG TERM GOAL #2   Title  Patient will increase RUE strength to 5/5 in order to return to normal yardwork  activity and exercise.     Time  12    Period  Weeks    Status  On-going      OT LONG TERM GOAL #3   Title  Patient will report a decrease in pain of approximately 2/10 while completing desired daily tasks.     Time  12    Period  Weeks    Status  On-going            Plan - 08/19/18 1513    Clinical Impression Statement   A: Patient was able to complete A/ROM supine and standing. HEP was updated for use at home with A/ROM shoulder exercises. VC for form and technique were provided. Added scapular theraband to increase shoulder and scapular strengthening.     Body Structure / Function / Physical Skills  Pain;Fascial restriction;ROM;Strength;UE functional use    Plan  P: Provide pt with scapular theraband for HEP.     Consulted and Agree with Plan of Care  Patient       Patient will benefit from skilled therapeutic intervention in order to improve the following deficits and impairments:  Body Structure / Function / Physical Skills  Visit Diagnosis: Acute pain of right shoulder  Stiffness of right shoulder, not elsewhere classified  Other symptoms and signs involving the musculoskeletal system    Problem List Patient Active Problem List   Diagnosis Date Noted  . Special screening for malignant neoplasms, colon 11/20/2017  . Hypertension 07/03/2017  . Social anxiety disorder 07/03/2017  . Tobacco abuse 07/03/2017  . Tubular adenoma 07/03/2017   Ailene Ravel, OTR/L,CBIS  [phone]  08/19/2018, 5:07 PM  [facility] [address] [place], [place], [address] Phone: [phone]   Fax:  [phone]  Name: Megan Schaefer MRN: [id number] Date of Birth: [birth year]

## 2018-08-20 ENCOUNTER — Telehealth (HOSPITAL_COMMUNITY): Payer: Self-pay | Admitting: Occupational Therapy

## 2018-08-20 NOTE — Telephone Encounter (Signed)
Called and left message for pt regarding 2 week clinic closure for COVID-19 precautions. Asked pt to return call to discuss HEP and reschedule appts.    Guadelupe Sabin, OTR/L  [phone] 08/20/2018

## 2018-08-23 ENCOUNTER — Ambulatory Visit (HOSPITAL_COMMUNITY): Payer: BLUE CROSS/BLUE SHIELD

## 2018-08-24 ENCOUNTER — Encounter (HOSPITAL_COMMUNITY): Payer: BLUE CROSS/BLUE SHIELD

## 2018-08-26 ENCOUNTER — Ambulatory Visit (HOSPITAL_COMMUNITY): Payer: BLUE CROSS/BLUE SHIELD

## 2018-08-31 ENCOUNTER — Encounter (HOSPITAL_COMMUNITY): Payer: BLUE CROSS/BLUE SHIELD

## 2018-09-02 ENCOUNTER — Encounter (HOSPITAL_COMMUNITY): Payer: BLUE CROSS/BLUE SHIELD

## 2018-09-02 ENCOUNTER — Telehealth (HOSPITAL_COMMUNITY): Payer: Self-pay | Admitting: Occupational Therapy

## 2018-09-02 NOTE — Telephone Encounter (Signed)
Patient was contacted today regarding the temporary reduction of OP rehab services due to concerns for community transmission of Covid-19.   Left message for pt explaining that all appts are canceled until further notice. Asked pt to return call to discuss HEP and potential for telehealth visits as well as updated HEP.    Guadelupe Sabin, OTR/L  [phone] 09/02/2018

## 2018-09-07 ENCOUNTER — Encounter (HOSPITAL_COMMUNITY): Payer: BLUE CROSS/BLUE SHIELD

## 2018-09-07 ENCOUNTER — Telehealth (HOSPITAL_COMMUNITY): Payer: Self-pay

## 2018-09-07 NOTE — Telephone Encounter (Signed)
Patient was calling to get up date on Telehealth, she wants to start as soon as possible. NF 09/07/2018

## 2018-09-09 ENCOUNTER — Encounter (HOSPITAL_COMMUNITY): Payer: BLUE CROSS/BLUE SHIELD

## 2018-09-13 ENCOUNTER — Telehealth (HOSPITAL_COMMUNITY): Payer: Self-pay | Admitting: Family Medicine

## 2018-09-13 NOTE — Telephone Encounter (Signed)
09/13/18  Spoke with patient and confirmed Telehealth appt and informed of insurance verification information

## 2018-09-14 ENCOUNTER — Encounter (HOSPITAL_COMMUNITY): Payer: BLUE CROSS/BLUE SHIELD

## 2018-09-16 ENCOUNTER — Encounter (HOSPITAL_COMMUNITY): Payer: BLUE CROSS/BLUE SHIELD

## 2018-09-16 ENCOUNTER — Telehealth (HOSPITAL_COMMUNITY): Payer: Self-pay | Admitting: Family Medicine

## 2018-09-16 ENCOUNTER — Telehealth (HOSPITAL_COMMUNITY): Payer: Self-pay | Admitting: Occupational Therapy

## 2018-09-16 ENCOUNTER — Ambulatory Visit (HOSPITAL_COMMUNITY): Payer: BLUE CROSS/BLUE SHIELD | Attending: Specialist | Admitting: Occupational Therapy

## 2018-09-16 ENCOUNTER — Encounter (HOSPITAL_COMMUNITY): Payer: Self-pay | Admitting: Occupational Therapy

## 2018-09-16 ENCOUNTER — Other Ambulatory Visit: Payer: Self-pay

## 2018-09-16 DIAGNOSIS — M25511 Pain in right shoulder: Secondary | ICD-10-CM

## 2018-09-16 DIAGNOSIS — M25611 Stiffness of right shoulder, not elsewhere classified: Secondary | ICD-10-CM | POA: Diagnosis not present

## 2018-09-16 DIAGNOSIS — R29898 Other symptoms and signs involving the musculoskeletal system: Secondary | ICD-10-CM

## 2018-09-16 NOTE — Therapy (Signed)
[facility] [place], [place], [address] Phone: [phone]   Fax:  [phone]  Occupational Therapy Treatment  Patient Details  Name: Megan Schaefer MRN: [id number] Date of Birth: 04-12-1956 Referring Provider (OT): [name], MD  Occupational Therapy Telehealth Visit:  I connected with Megan Schaefer today at 1422 by [facility] video conference and verified that I am speaking with the correct person using two identifiers.  I discussed the limitations, risks, security and privacy concerns of performing an evaluation and management service by Webex and the availability of in person appointments.  I also discussed with the patient that there may be a patient responsible charge related to this service. The patient expressed understanding and agreed to proceed.    The patient's address was confirmed.  Identified to the patient that therapist is a licensed OTR/L in the state of [place].  Verified phone # as [phone]  to call in case of technical difficulties.      Encounter Date: 09/16/2018  OT End of Session - 09/16/18 1517    Visit Number  8    Number of Visits  24    Date for OT Re-Evaluation  10/21/18    Authorization Type  BCBS     Authorization Time Period  covered 100%. 30 visits for OT/PT/Chiro. 0 used.     Authorization - Visit Number  8    Authorization - Number of Visits  30    OT Start Time  0034    OT Stop Time  1500    OT Time Calculation (min)  38 min    Activity Tolerance  Patient tolerated treatment well    Behavior During Therapy  WFL for tasks assessed/performed       Past Medical History:  Diagnosis Date  . Anxiety   . Arthritis   . Depression   . Eczema   . Hypertension     Past Surgical History:  Procedure Laterality Date  . BIOPSY  04/21/2018   Procedure: BIOPSY;  Surgeon: [name], MD;  [facility];  Service: Endoscopy;;  . COLONOSCOPY N/A 10/20/2012   Procedure:  COLONOSCOPY;  Surgeon: [name], MD;  [facility];  Service: Endoscopy;  Laterality: N/A;  930  . COLONOSCOPY N/A 04/21/2018   Procedure: COLONOSCOPY;  Surgeon: [name], MD;  [facility];  Service: Endoscopy;  Laterality: N/A;  930  . FINGER GANGLION CYST EXCISION Right    pinky  . KNEE SURGERY Right     There were no vitals filed for this visit.  Subjective Assessment - 09/16/18 1514    Subjective   S: The doctor said to be careful when cranking a lawnmower and to keep my palm facing up.     Currently in Pain?  No/denies         [facility] OT Assessment - 09/16/18 1511      Assessment   Medical Diagnosis  right shoulder RTC repair      Precautions   Precautions  Shoulder    Type of Shoulder Precautions  progress as tolerated      AROM   Overall AROM Comments  ROM assessed via observation and clinical judgement through telehealth. RUE A/ROM is WNL in all ranges, equivalent to LUE.    AROM Assessment Site  Shoulder    Right/Left Shoulder  Right      Strength   Overall Strength Comments  Not previously assessed. Unable to  complete formal MMT via telehealth services, on observation pt appears to be WFL-no difficulty lifting lightweight objects (1-5#s)               OT Treatments/Exercises (OP) - 09/16/18 1514      ADLs   ADL Comments  Discussed pt's progress since in clinic visits were put on hold. Pt reports no difficulty with ADLs, is not able to unbutton her bra behind her back but is unable to button behind her back. Pt reports soreness in the morning that resolves once she is able to get up and begin moving her arm.       Exercises   Exercises  Shoulder      Shoulder Exercises: Standing   Protraction  Strengthening;10 reps    Protraction Weight (lbs)  1    Horizontal ABduction  Strengthening;10 reps    Horizontal ABduction Weight (lbs)  1    External Rotation  Strengthening;10 reps   abducted   External Rotation Weight  (lbs)  1    Internal Rotation  Strengthening;10 reps   abducted   Internal Rotation Weight (lbs)  1    Flexion  Strengthening;10 reps    Shoulder Flexion Weight (lbs)  1    ABduction  Strengthening;10 reps    Shoulder ABduction Weight (lbs)  1    Diagonals  Strengthening;10 reps    Diagonals Weight (lbs)  1    Other Standing Exercises  overhead press, 10X, 1#      Shoulder Exercises: ROM/Strengthening   Wall Pushups  10 reps    Pushups  10 reps   countertop height   "W" Arms  10X, 1#    X to V Arms  10X, 1#    Proximal Shoulder Strengthening, Seated  12X each no rest breaks    Ball on Wall  1' flexion 1' abduction    Other ROM/Strengthening Exercises  arnold press, 10X, 1#    Other ROM/Strengthening Exercises  arms on fire, 2' total, 5 exercises 15" each               OT Short Term Goals - 08/05/18 1544      OT SHORT TERM GOAL #1   Title  Patient will be educated and independent with her HEP in order to faciliate her progress in therapy and allow her to return to using her RUE as her dominant extremity for all daily tasks.     Time  6    Period  Weeks    Status  On-going    Target Date  09/09/18      OT SHORT TERM GOAL #2   Title  Patient will increase her RUE P/ROM to WNL in order to increase her ability to complete upper body dressing tasks and grooming tasks with less difficulty.     Time  6    Period  Weeks    Status  On-going      OT SHORT TERM GOAL #3   Title  Patient will increase RUE strength to 3+/5 in order to complete lightweight household lifting tasks at waist level.     Time  6    Period  Weeks    Status  On-going      OT SHORT TERM GOAL #4   Title  Patient will decrease fascial restrictions to trace amount in her RUE in order to increase the functional mobility needed to complete reaching tasks at shoulder level.     Time  6  Period  Weeks    Status  On-going      OT SHORT TERM GOAL #5   Title  Patient will report a decrease in pain when  completing waist level daily tasks with her RUE of approximately 4/10.     Time  6    Period  Weeks    Status  On-going        OT Long Term Goals - 08/05/18 1544      OT LONG TERM GOAL #1   Title  Patient will increase RUE A/ROM to [facility] in order to complete functional reaching tasks above shoulder or overhead.     Time  12    Period  Weeks    Status  On-going      OT LONG TERM GOAL #2   Title  Patient will increase RUE strength to 5/5 in order to return to normal yardwork activity and exercise.     Time  12    Period  Weeks    Status  On-going      OT LONG TERM GOAL #3   Title  Patient will report a decrease in pain of approximately 2/10 while completing desired daily tasks.     Time  12    Period  Weeks    Status  On-going            Plan - 09/16/18 1517    Clinical Impression Statement  A: Session completed via telehealth services today. Mini-reassessment completed, pt has ROM WNL and equivalent to LUE, strength appears to be [facility] [place]. Pt reports she has no restrictions, however MD did say not to attempt cranking her lawnmower yet. Session focusing on progressing to strengthening exercises using proper form to promote optimal functioning. Occasional verbal cuing for form and initial start to new exercises.      Body Structure / Function / Physical Skills  Pain;Fascial restriction;ROM;Strength;UE functional use    Plan  P: Mail scapular theraband so pt can participate in scapular strengthening, update HEP for strengthening       Patient will benefit from skilled therapeutic intervention in order to improve the following deficits and impairments:  Body Structure / Function / Physical Skills  Visit Diagnosis: Acute pain of right shoulder  Stiffness of right shoulder, not elsewhere classified  Other symptoms and signs involving the musculoskeletal system    Problem List Patient Active Problem List   Diagnosis Date Noted  . Special screening for malignant neoplasms,  colon 11/20/2017  . Hypertension 07/03/2017  . Social anxiety disorder 07/03/2017  . Tobacco abuse 07/03/2017  . Tubular adenoma 07/03/2017   Megan Schaefer, OTR/L  [phone] 09/16/2018, 3:26 PM  [facility] [address] [place], [place], [address] Phone: [phone]   Fax:  [phone]  Name: Megan Schaefer MRN: [id number] Date of Birth: 01/17/1956

## 2018-09-16 NOTE — Telephone Encounter (Signed)
Megan Schaefer was contacted today regarding transition if in-person OP Rehab Services to telehealth due to Covid-19. Pt consented to telehealth services, educated on MyChart signup, [employer], and was agreeable to receive information via (text/email) regarding telehealth services. Pt consented and was scheduled for appointment.

## 2018-09-16 NOTE — Telephone Encounter (Signed)
09/16/18  Called back on [employer] to verify coverage for Telehealth visits and was told that recent updates stated [place] - only for professional charges.

## 2018-09-17 ENCOUNTER — Encounter (HOSPITAL_COMMUNITY): Payer: Self-pay | Admitting: Occupational Therapy

## 2018-09-17 ENCOUNTER — Telehealth (HOSPITAL_COMMUNITY): Payer: Self-pay | Admitting: Occupational Therapy

## 2018-09-17 NOTE — Telephone Encounter (Signed)
Left message for pt on home phone informing of need to cancel remaining appointments due to [facility] not covering telehealth visits. Will send updated HEP and follow up weekly phone calls to discuss. Asked pt to return call with any questions.    Guadelupe Sabin, OTR/L  [phone] 09/17/2018

## 2018-09-17 NOTE — Therapy (Signed)
[facility] [address] [place], [place], [address] Phone: [phone]   Fax:  [phone]  Patient Details  Name: Megan Schaefer MRN: [id number] Date of Birth: [birth year] Referring Provider:  No ref. provider found  Encounter Date: 09/17/2018   Pt emailed copy of HEP via [place], mailed red theraband. HEP included strengthening and stabilization as follows:   Access Code: [email or web address]  URL: [email or web address]  Date: 09/17/2018  Prepared by: Guadelupe Sabin   Exercises  Standing Full Range Shoulder Flexion with Dumbbells - 15 reps - 1 sets - 1x daily - 7x weekly  Scapular Protraction with Dumbbells - 15 reps - 1 sets - 1x daily - 7x weekly  Standing Shoulder Horizontal Abduction with Dumbbells - Palms Down - 15 reps - 1 sets - 1x daily - 7x weekly  Standing External Rotation at 90 with Dumbbell - 15 reps - 1 sets - 1x daily - 7x weekly  Standing Shoulder Horizontal Abduction with Dumbbells - Thumbs Up - 15 reps - 1 sets - 1x daily - 7x weekly  Standing Overhead Press with Dumbbells at [place] 15 reps - 1 sets - 1x daily - 7x weekly  Wall Push Up with Plus - 10 reps - 1 sets - 1x daily - 7x weekly  Standing Wall [employer] with [employer] - 1 sets - 1x daily - 7x weekly  Standing Wall Ball Circles in [place] with Mini Swiss Ball - 1 sets - 1x daily - 7x weekly  Shoulder extension with resistance - Neutral - 10 reps - 1 sets - 1x daily - 7x weekly  Standing Row with Resistance - 10 reps - 1 sets - 1x daily - 7x weekly  Standing Row with Resistance with Anchored Resistance at Chest Height Palms Down - 10 reps - 1 sets - 1x daily - 7x weekly    Guadelupe Sabin, OTR/L  [phone] 09/17/2018, 11:13 AM  [place], [place], [address] Phone: [phone]   Fax:  [phone]

## 2018-09-21 ENCOUNTER — Ambulatory Visit (HOSPITAL_COMMUNITY): Payer: BLUE CROSS/BLUE SHIELD | Admitting: Occupational Therapy

## 2018-09-21 ENCOUNTER — Encounter (HOSPITAL_COMMUNITY): Payer: BLUE CROSS/BLUE SHIELD

## 2018-09-23 ENCOUNTER — Encounter (HOSPITAL_COMMUNITY): Payer: BLUE CROSS/BLUE SHIELD

## 2018-09-23 ENCOUNTER — Ambulatory Visit (HOSPITAL_COMMUNITY): Payer: BLUE CROSS/BLUE SHIELD | Admitting: Occupational Therapy

## 2018-09-28 ENCOUNTER — Ambulatory Visit (HOSPITAL_COMMUNITY): Payer: BLUE CROSS/BLUE SHIELD | Admitting: Occupational Therapy

## 2018-09-28 ENCOUNTER — Encounter (HOSPITAL_COMMUNITY): Payer: BLUE CROSS/BLUE SHIELD

## 2018-09-30 ENCOUNTER — Ambulatory Visit (HOSPITAL_COMMUNITY): Payer: BLUE CROSS/BLUE SHIELD | Admitting: Occupational Therapy

## 2018-09-30 ENCOUNTER — Encounter (HOSPITAL_COMMUNITY): Payer: BLUE CROSS/BLUE SHIELD

## 2018-10-01 ENCOUNTER — Telehealth (HOSPITAL_COMMUNITY): Payer: Self-pay | Admitting: Occupational Therapy

## 2018-10-01 NOTE — Telephone Encounter (Signed)
Called and left message for pt to follow up on HEP for shoulder strengthening.    Guadelupe Sabin, OTR/L  [phone] 10/01/2018

## 2018-10-05 ENCOUNTER — Encounter (HOSPITAL_COMMUNITY): Payer: BLUE CROSS/BLUE SHIELD | Admitting: Occupational Therapy

## 2018-10-05 ENCOUNTER — Encounter (HOSPITAL_COMMUNITY): Payer: BLUE CROSS/BLUE SHIELD

## 2018-10-07 ENCOUNTER — Encounter (HOSPITAL_COMMUNITY): Payer: BLUE CROSS/BLUE SHIELD | Admitting: Occupational Therapy

## 2018-10-07 ENCOUNTER — Encounter (HOSPITAL_COMMUNITY): Payer: BLUE CROSS/BLUE SHIELD

## 2018-10-08 ENCOUNTER — Telehealth (HOSPITAL_COMMUNITY): Payer: Self-pay | Admitting: Occupational Therapy

## 2018-10-08 NOTE — Telephone Encounter (Signed)
Left message for pt to offer in-clinic follow up appointment for her shoulder. Asked pt to return call to schedule or discharge.    Guadelupe Sabin, OTR/L  [phone] 10/08/2018

## 2018-10-12 ENCOUNTER — Encounter (HOSPITAL_COMMUNITY): Payer: BLUE CROSS/BLUE SHIELD

## 2018-10-12 ENCOUNTER — Other Ambulatory Visit: Payer: Self-pay

## 2018-10-12 ENCOUNTER — Encounter (HOSPITAL_COMMUNITY): Payer: BLUE CROSS/BLUE SHIELD | Admitting: Occupational Therapy

## 2018-10-12 ENCOUNTER — Encounter (HOSPITAL_COMMUNITY): Payer: Self-pay | Admitting: Occupational Therapy

## 2018-10-12 ENCOUNTER — Ambulatory Visit (HOSPITAL_COMMUNITY): Payer: BLUE CROSS/BLUE SHIELD | Attending: Specialist | Admitting: Occupational Therapy

## 2018-10-12 DIAGNOSIS — M25611 Stiffness of right shoulder, not elsewhere classified: Secondary | ICD-10-CM

## 2018-10-12 DIAGNOSIS — R29898 Other symptoms and signs involving the musculoskeletal system: Secondary | ICD-10-CM | POA: Diagnosis not present

## 2018-10-12 DIAGNOSIS — M25511 Pain in right shoulder: Secondary | ICD-10-CM

## 2018-10-12 NOTE — Therapy (Signed)
[facility] [place], [place], [address] Phone: [phone]   Fax:  [phone]  Occupational Therapy Reassessment, Treatment, Discharge Summary  Patient Details  Name: Megan Schaefer MRN: [id number] Date of Birth: 09/28/1955 Referring Provider (OT): [name], MD   Progress Note Reporting Period 07/26/2018 to 10/12/2018  See note below for Objective Data and Assessment of Progress/Goals.       Encounter Date: 10/12/2018  OT End of Session - 10/12/18 1734    Visit Number  9    Number of Visits  24    Date for OT Re-Evaluation  10/21/18    Authorization Type  [employer]     Authorization Time Period  covered 100%. 30 visits for OT/PT/Chiro. 0 used.     Authorization - Visit Number  9    Authorization - Number of Visits  30    OT Start Time  1628    OT Stop Time  5427    OT Time Calculation (min)  45 min    Activity Tolerance  Patient tolerated treatment well    Behavior During Therapy  WFL for tasks assessed/performed       Past Medical History:  Diagnosis Date  . Anxiety   . Arthritis   . Depression   . Eczema   . Hypertension     Past Surgical History:  Procedure Laterality Date  . BIOPSY  04/21/2018   Procedure: BIOPSY;  Surgeon: [name], MD;  [facility];  Service: Endoscopy;;  . COLONOSCOPY N/A 10/20/2012   Procedure: COLONOSCOPY;  Surgeon: [name], MD;  [facility];  Service: Endoscopy;  Laterality: N/A;  930  . COLONOSCOPY N/A 04/21/2018   Procedure: COLONOSCOPY;  Surgeon: [name], MD;  [facility]: [facility];  Service: Endoscopy;  Laterality: N/A;  930  . FINGER GANGLION CYST EXCISION Right    pinky  . KNEE SURGERY Right     There were no vitals filed for this visit.  Subjective Assessment - 10/12/18 1625    Subjective   S: I think my arm is doing really well.     Currently in Pain?  No/denies         [facility] OT Assessment - 10/12/18 1625       Assessment   Medical Diagnosis  right shoulder RTC repair      Precautions   Precautions  Shoulder    Type of Shoulder Precautions  progress as tolerated      Observation/Other Assessments   Focus on Therapeutic Outcomes (FOTO)   87/100   4/100 previous     AROM   Overall AROM Comments  Assessed standing, er/IR adducted    AROM Assessment Site  Shoulder    Right/Left Shoulder  Right    Right Shoulder Flexion  180 Degrees    Right Shoulder ABduction  180 Degrees    Right Shoulder Internal Rotation  90 Degrees    Right Shoulder External Rotation  70 Degrees      Strength   Overall Strength Comments  Assessed seated, er/IR adducted    Strength Assessment Site  Shoulder    Right/Left Shoulder  Right    Right Shoulder Flexion  4+/5    Right Shoulder ABduction  4+/5    Right Shoulder Internal Rotation  5/5    Right Shoulder External Rotation  4+/5    Right Shoulder Horizontal ABduction  0/5  OT Treatments/Exercises (OP) - 10/12/18 1635      Exercises   Exercises  Shoulder      Shoulder Exercises: Prone   Other Prone Exercises  bird dog, modified high plank (on knees), 30" holds      Shoulder Exercises: Standing   Protraction  Strengthening;Theraband;10 reps    Theraband Level (Shoulder Protraction)  Level 3 (Green)    Protraction Weight (lbs)  3    Horizontal ABduction  Strengthening;Theraband;10 reps    Theraband Level (Shoulder Horizontal ABduction)  Level 3 (Green)    Horizontal ABduction Weight (lbs)  3    External Rotation  Theraband;10 reps    Theraband Level (Shoulder External Rotation)  Level 3 (Green)    Internal Rotation  Theraband;10 reps    Theraband Level (Shoulder Internal Rotation)  Level 3 (Green)    Flexion  Theraband;Strengthening;10 reps    Theraband Level (Shoulder Flexion)  Level 3 (Green)    Shoulder Flexion Weight (lbs)  3    ABduction  Strengthening;Theraband;10 reps    Theraband Level (Shoulder ABduction)  Level 3  (Green)    Shoulder ABduction Weight (lbs)  3    ABduction Limitations  to 90 degrees    Other Standing Exercises  ball drop in flexion, green weighted ball, 1'      Shoulder Exercises: ROM/Strengthening   X to V Arms  10X, 3#    Other ROM/Strengthening Exercises  arnold press, 10X, 3#    Other ROM/Strengthening Exercises  body blade: shoulder press, row, chest fly 15X, 10# plate             OT Education - 10/12/18 1741    Education Details  progressive strengthening with green band, plank work    [employer]) Educated  Patient    Methods  Explanation;Demonstration;Verbal cues;Handout    Comprehension  Returned demonstration;Verbalized understanding       OT Short Term Goals - 10/12/18 1639      OT SHORT TERM GOAL #1   Title  Patient will be educated and independent with her HEP in order to faciliate her progress in therapy and allow her to return to using her RUE as her dominant extremity for all daily tasks.     Time  6    Period  Weeks    Status  Achieved    Target Date  09/09/18      OT SHORT TERM GOAL #2   Title  Patient will increase her RUE P/ROM to WNL in order to increase her ability to complete upper body dressing tasks and grooming tasks with less difficulty.     Time  6    Period  Weeks    Status  Achieved      OT SHORT TERM GOAL #3   Title  Patient will increase RUE strength to 3+/5 in order to complete lightweight household lifting tasks at waist level.     Time  6    Period  Weeks    Status  Achieved      OT SHORT TERM GOAL #4   Title  Patient will decrease fascial restrictions to trace amount in her RUE in order to increase the functional mobility needed to complete reaching tasks at shoulder level.     Time  6    Period  Weeks    Status  Achieved      OT SHORT TERM GOAL #5   Title  Patient will report a decrease in pain when completing waist level daily  tasks with her RUE of approximately 4/10.     Time  6    Period  Weeks    Status  Achieved         OT Long Term Goals - 10/12/18 1639      OT LONG TERM GOAL #1   Title  Patient will increase RUE A/ROM to [facility] in order to complete functional reaching tasks above shoulder or overhead.     Time  12    Period  Weeks    Status  Achieved      OT LONG TERM GOAL #2   Title  Patient will increase RUE strength to 5/5 in order to return to normal yardwork activity and exercise.     Time  12    Period  Weeks    Status  Partially Met      OT LONG TERM GOAL #3   Title  Patient will report a decrease in pain of approximately 2/10 while completing desired daily tasks.     Time  12    Period  Weeks    Status  Achieved            Plan - 10/12/18 1735    Clinical Impression Statement  A: Reassessment completed this session. Pt has met all STGs and 2/3 LTGs and has partially met her LTG for strength. Pt demonstrates ROM WNL and is able to use her RUE as dominant in all ADL tasks. Pt has minimal stiffness in the mornings, has mild difficulty reaching back to wash. Pt performing strengthening exercises without difficulty today, educated on progressive strengthening HEP. Pt is agreeable to discharge with HEP.     Body Structure / Function / Physical Skills  Pain;Fascial restriction;ROM;Strength;UE functional use    Plan  P: Discharge pt       Patient will benefit from skilled therapeutic intervention in order to improve the following deficits and impairments:  Body Structure / Function / Physical Skills  Visit Diagnosis: Acute pain of right shoulder  Stiffness of right shoulder, not elsewhere classified  Other symptoms and signs involving the musculoskeletal system    Problem List Patient Active Problem List   Diagnosis Date Noted  . Special screening for malignant neoplasms, colon 11/20/2017  . Hypertension 07/03/2017  . Social anxiety disorder 07/03/2017  . Tobacco abuse 07/03/2017  . Tubular adenoma 07/03/2017    Guadelupe Sabin, OTR/L  [phone] 10/12/2018, 5:42  PM  [facility] [address] [place], [place], [address] Phone: [phone]   Fax:  [phone]  Name: Megan Schaefer MRN: [id number] Date of Birth: 05/23/1956    OCCUPATIONAL THERAPY DISCHARGE SUMMARY  Visits from Start of Care: 9  Current functional level related to goals / functional outcomes: See above. Pt is performing ADLs and leisure tasks without difficulty or pain. ROM and strength are WNL.    Remaining deficits: Occasional stiffness, decreased activity tolerance   Education / Equipment: HEP for progressive strengthening  Plan: Patient agrees to discharge.  Patient goals were met. Patient is being discharged due to meeting the stated rehab goals.  ?????

## 2018-10-12 NOTE — Patient Instructions (Addendum)
Theraband strengthening: Complete 10-15X, 1-2X/day  1) Shoulder protraction  Anchor band in doorway, stand with back to door. Push your hand forward as much as you can to bringing your shoulder blades forward on your rib cage.      2) Shoulder horizontal abduction  Standing with a theraband anchored at chest height, begin with arm straight and some tension in the band. Move your arm out to your side (keeping straight the whole time). Bring the affected arm back to midline.     3) Shoulder Internal Rotation  While holding an elastic band at your side with your elbow bent, start with your hand away from your stomach, then pull the band towards your stomach. Keep your elbow near your side the entire time.     4) Shoulder External Rotation  While holding an elastic band at your side with your elbow bent, start with your hand near your stomach and then pull the band away. Keep your elbow at your side the entire time.     5) Shoulder flexion  While standing with back to the door, holding Theraband at hand level, raise arm in front of you.  Keep elbow straight through entire movement.      6) Shoulder abduction  While holding an elastic band at your side, draw up your arm to the side keeping your elbow straight.      7) X to V arms (cheerleader move): using a 2 or 3lb weight Begin with arms straight down, crossed in front of body in an "X". Keeping arms crossed, lift arms straight up overhead. Then spread arms apart into a "V" shape.  Bring back together into x and lower down to starting position.    8) W arms: no weight Begin with elbows bent and even with shoulders, hands pointing to ceiling. Keeping elbows at shoulder level, 1-shrug shoulders up, 2-squeeze shoulder blades together, and 3-relax.    9) Bird Dogs-30 second holds (alternating arms/legs) Hold a plank position in full elbow extension position with your legs spread slightly apart as shown. Do not let your  back arch down. While holding this position, raise one arm up and then set it back down. Then perform on the opposite arm and repeat.    10) Bent over rows-with green/blue band or weights; 10X  Hold one end of the elastic band in each hand. Lay the band across the floor and step on it with feet shoulder width apart. Hinge at the hips to maintain a quarter squat.  Pull your hands towards your hips, squeezing the shoulder blades together. Do not let your shoulder rise up towards your elbows. Slowly return to starting position and repeat.

## 2018-10-14 ENCOUNTER — Encounter (HOSPITAL_COMMUNITY): Payer: BLUE CROSS/BLUE SHIELD

## 2018-10-14 ENCOUNTER — Ambulatory Visit (HOSPITAL_COMMUNITY): Payer: BLUE CROSS/BLUE SHIELD | Admitting: Occupational Therapy

## 2018-10-19 ENCOUNTER — Encounter (HOSPITAL_COMMUNITY): Payer: BLUE CROSS/BLUE SHIELD | Admitting: Occupational Therapy

## 2018-10-19 ENCOUNTER — Encounter (HOSPITAL_COMMUNITY): Payer: BLUE CROSS/BLUE SHIELD

## 2018-10-21 ENCOUNTER — Encounter (HOSPITAL_COMMUNITY): Payer: BLUE CROSS/BLUE SHIELD | Admitting: Occupational Therapy

## 2018-10-21 ENCOUNTER — Encounter (HOSPITAL_COMMUNITY): Payer: BLUE CROSS/BLUE SHIELD

## 2018-10-26 ENCOUNTER — Encounter (HOSPITAL_COMMUNITY): Payer: BLUE CROSS/BLUE SHIELD | Admitting: Occupational Therapy

## 2018-10-28 ENCOUNTER — Encounter (HOSPITAL_COMMUNITY): Payer: BLUE CROSS/BLUE SHIELD | Admitting: Occupational Therapy

## 2018-11-23 DIAGNOSIS — I10 Essential (primary) hypertension: Secondary | ICD-10-CM | POA: Diagnosis not present

## 2018-11-23 DIAGNOSIS — Z13220 Encounter for screening for lipoid disorders: Secondary | ICD-10-CM | POA: Diagnosis not present

## 2018-11-23 DIAGNOSIS — Z124 Encounter for screening for malignant neoplasm of cervix: Secondary | ICD-10-CM | POA: Diagnosis not present

## 2018-11-23 DIAGNOSIS — R7302 Impaired glucose tolerance (oral): Secondary | ICD-10-CM | POA: Diagnosis not present

## 2018-11-23 DIAGNOSIS — Z136 Encounter for screening for cardiovascular disorders: Secondary | ICD-10-CM | POA: Diagnosis not present

## 2018-11-23 DIAGNOSIS — Z0000 Encounter for general adult medical examination without abnormal findings: Secondary | ICD-10-CM | POA: Diagnosis not present

## 2019-03-02 ENCOUNTER — Other Ambulatory Visit (HOSPITAL_COMMUNITY): Payer: Self-pay | Admitting: Family Medicine

## 2019-03-02 DIAGNOSIS — Z1231 Encounter for screening mammogram for malignant neoplasm of breast: Secondary | ICD-10-CM

## 2019-04-18 ENCOUNTER — Other Ambulatory Visit: Payer: Self-pay

## 2019-04-18 ENCOUNTER — Ambulatory Visit (HOSPITAL_COMMUNITY)
Admission: RE | Admit: 2019-04-18 | Discharge: 2019-04-18 | Disposition: A | Discharge status: Home / Self Care | Payer: BC Managed Care – PPO | Source: Ambulatory Visit | Attending: Family Medicine | Admitting: Family Medicine

## 2019-04-18 DIAGNOSIS — Z1231 Encounter for screening mammogram for malignant neoplasm of breast: Secondary | ICD-10-CM | POA: Insufficient documentation

## 2019-12-19 IMAGING — MG DIGITAL SCREENING BILAT W/ TOMO W/ CAD
8 series · 8 of 24 positions shown · non-contrast
Comparison: Previous exam(s).

CLINICAL DATA: Screening.

EXAM:
DIGITAL SCREENING BILATERAL MAMMOGRAM WITH TOMO AND CAD

[R CC synth-2D]
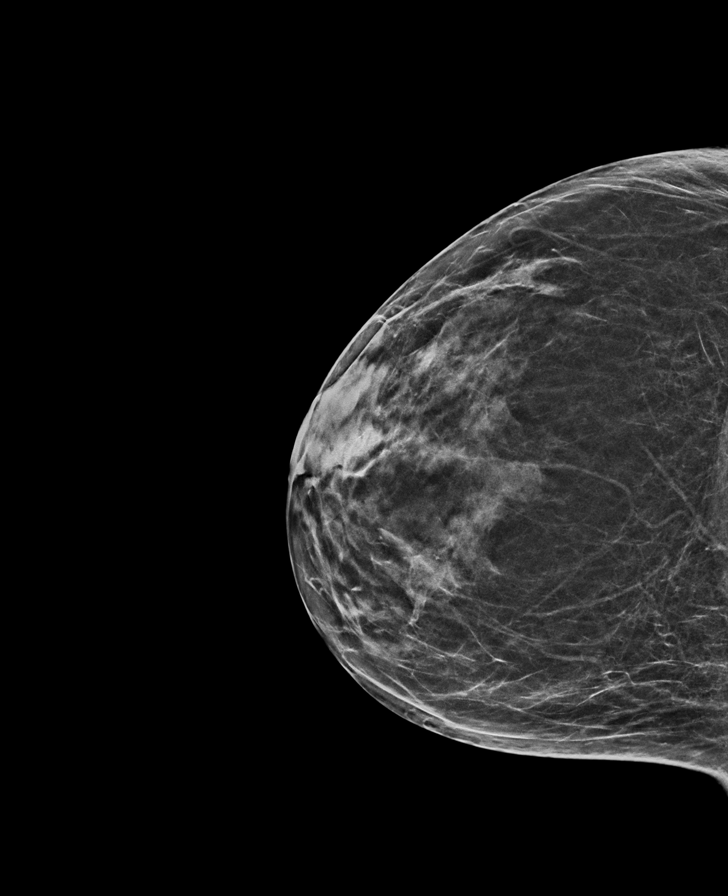

[R MLO synth-2D]
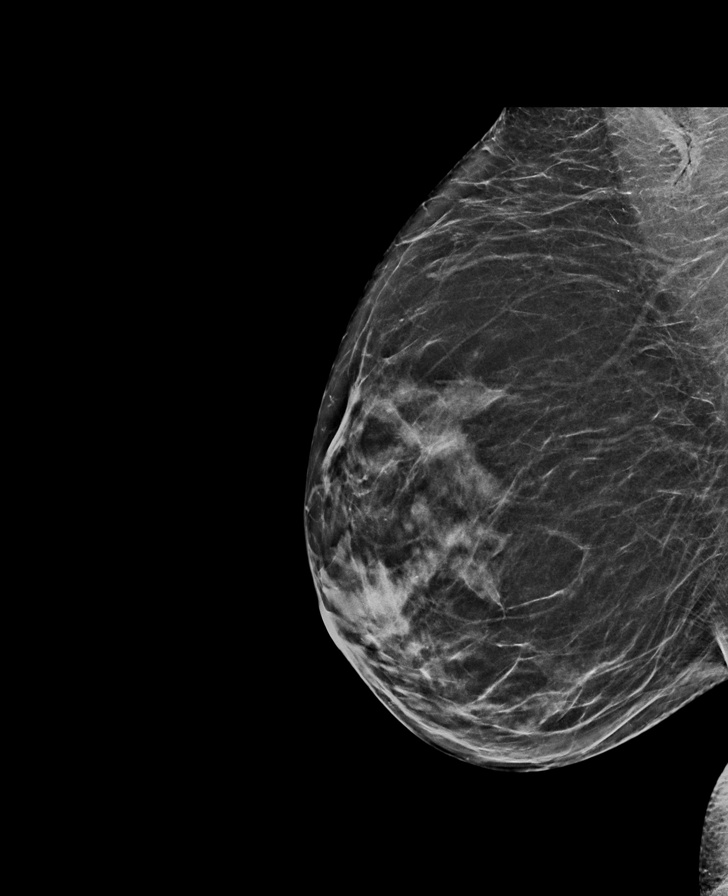

[L MLO synth-2D]
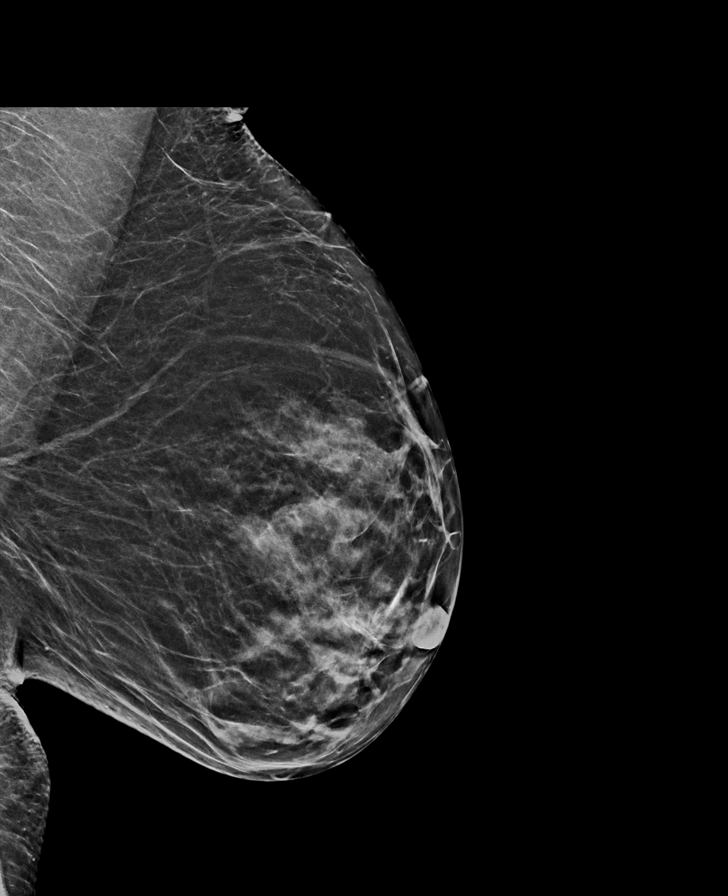

[L CC synth-2D]
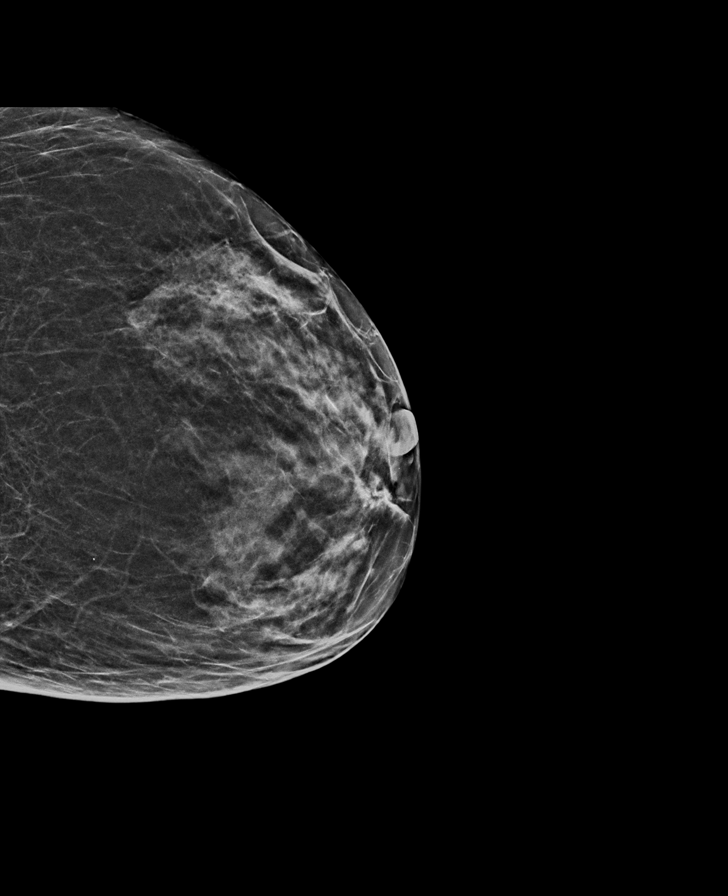

[R MLO tomo · tomo slice 32/63.0]
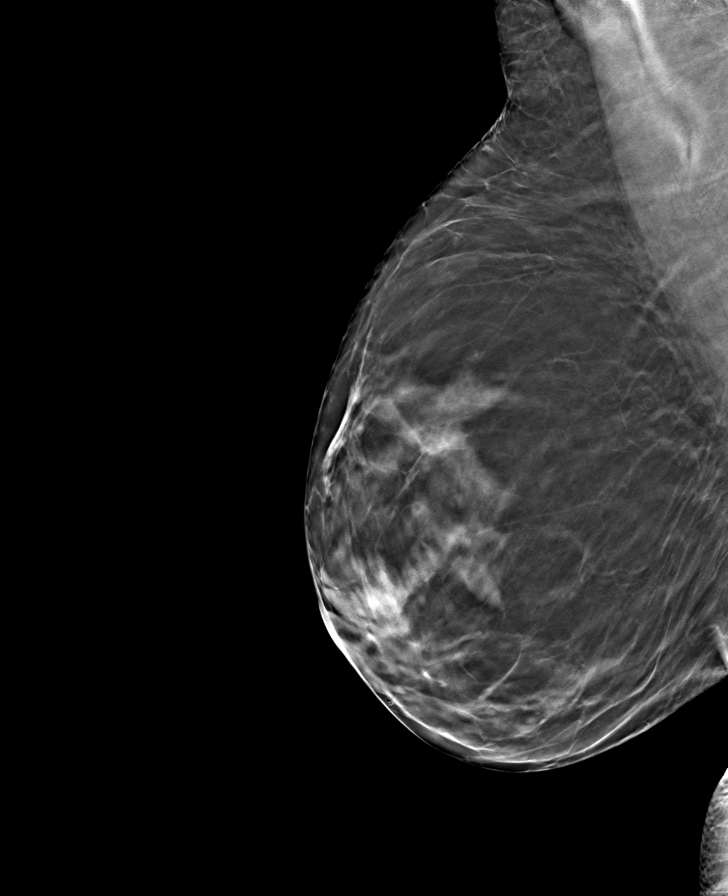

[L MLO tomo · tomo slice 31/61.0]
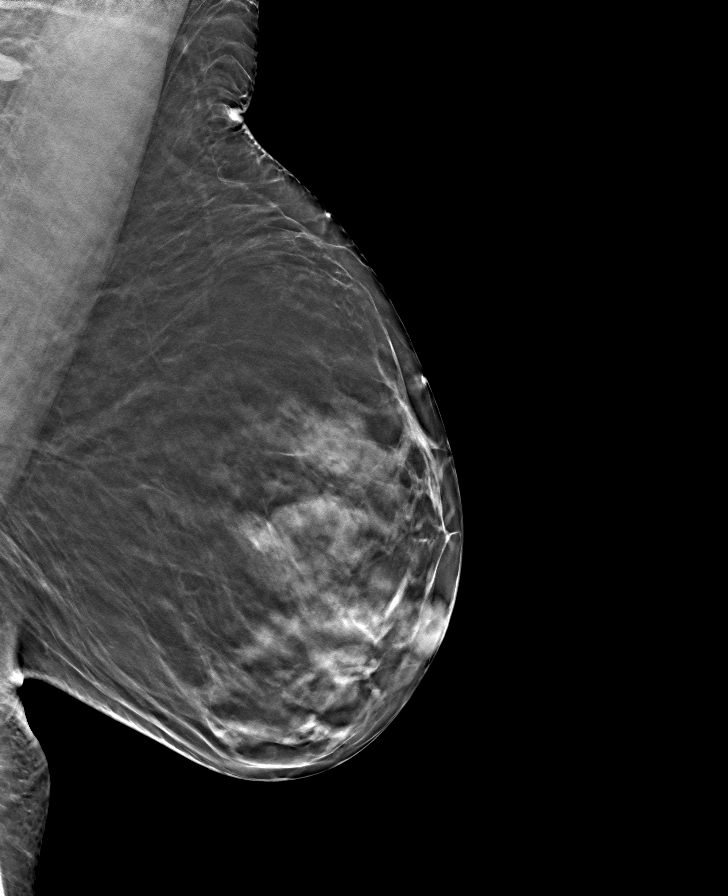

[L CC tomo · tomo slice 29/56.0]
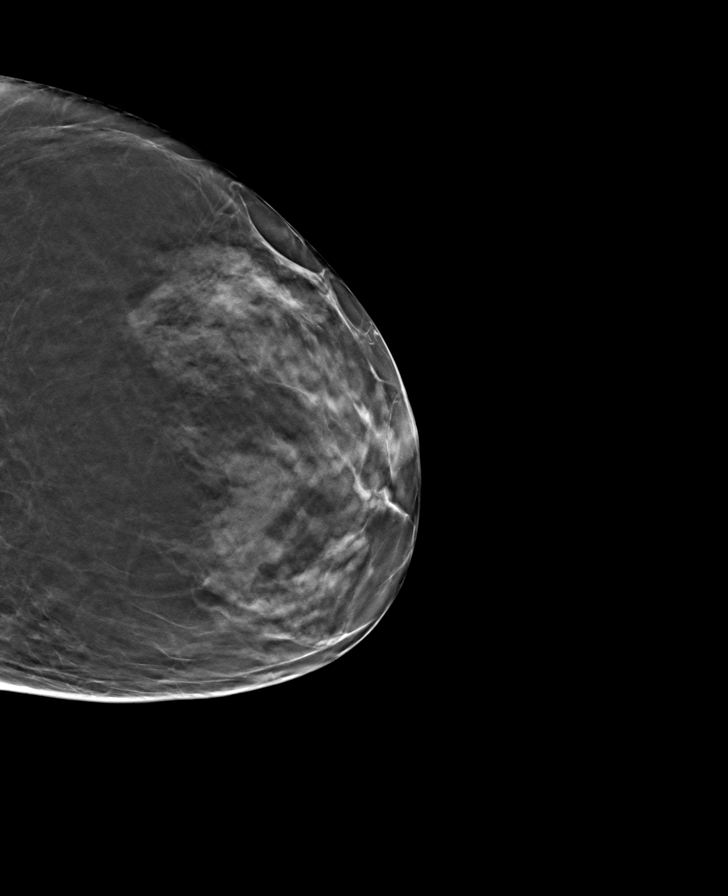

[R CC tomo · tomo slice 30/59.0]
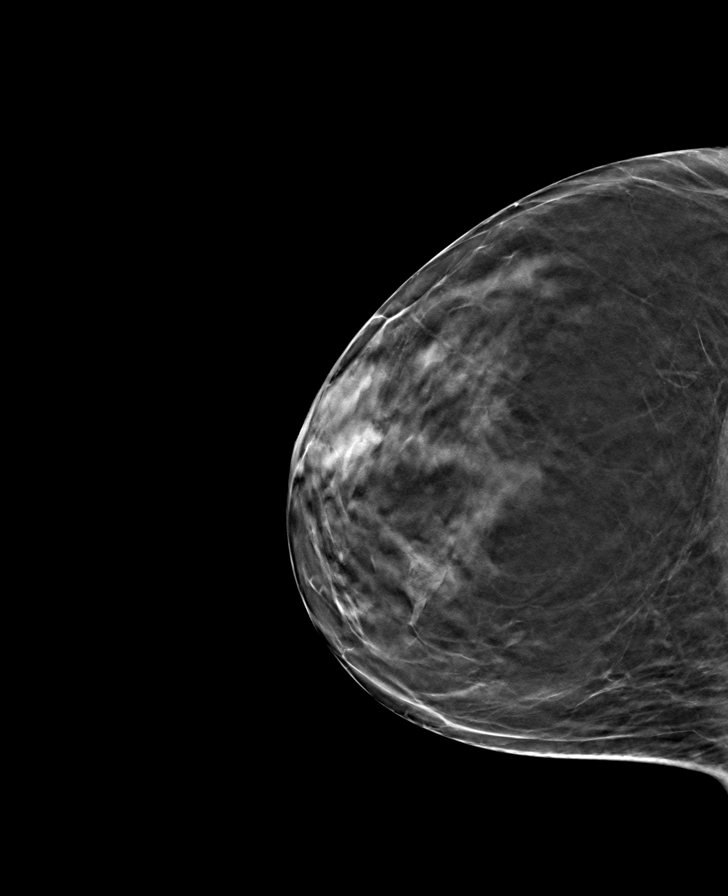

[8 of 24 positions shown; findings below may reference images not displayed]

ACR Breast Density Category c: The breast tissue is heterogeneously
dense, which may obscure small masses.
FINDINGS: There are no findings suspicious for malignancy. Images were
processed with CAD.
IMPRESSION: No mammographic evidence of malignancy. A result letter of this
screening mammogram will be mailed directly to the patient.

RECOMMENDATION:
Screening mammogram in one year. (Code:FT-U-LHB)

BI-RADS CATEGORY  1: Negative.

## 2020-03-07 ENCOUNTER — Other Ambulatory Visit (HOSPITAL_COMMUNITY): Payer: Self-pay | Admitting: Family Medicine

## 2020-03-07 DIAGNOSIS — Z1231 Encounter for screening mammogram for malignant neoplasm of breast: Secondary | ICD-10-CM

## 2020-04-16 ENCOUNTER — Other Ambulatory Visit: Payer: Self-pay

## 2020-04-16 ENCOUNTER — Encounter: Payer: Self-pay | Admitting: Podiatry

## 2020-04-16 ENCOUNTER — Ambulatory Visit: Payer: BC Managed Care – PPO | Admitting: Podiatry

## 2020-04-16 DIAGNOSIS — L84 Corns and callosities: Secondary | ICD-10-CM | POA: Diagnosis not present

## 2020-04-16 DIAGNOSIS — L309 Dermatitis, unspecified: Secondary | ICD-10-CM | POA: Diagnosis not present

## 2020-04-16 NOTE — Progress Notes (Signed)
Subjective:   Patient ID: Megan Schaefer, female   DOB: 64 y.o.   MRN: [id number]   HPI Patient states she developed a lot of lesions on both her feet and states the one on her right foot has been bothersome for her and just wanted to get it checked and states it is been that way for a while.  Patient smokes quarter pack per day likes to be active   Review of Systems  All other systems reviewed and are negative.       Objective:  Physical Exam Vitals and nursing note reviewed.  Constitutional:      Appearance: She is well-developed.  Pulmonary:     Effort: Pulmonary effort is normal.  Musculoskeletal:        General: Normal range of motion.  Skin:    General: Skin is warm.  Neurological:     Mental Status: She is alert.     Neurovascular status found to be intact muscle strength was adequate range of motion within normal limits.  Patient is noted to have on her right foot an area of irritation plantar that is localized with other lesions that have been there for years with a very thick foot structure and states she has to use a pumice stone on a regular basis     Assessment:  Probability that this is a new lesion that has to do with her other issues and with just the generalized pathology she has concerning her lesions that she forms     Plan:  H&P reviewed condition and sharp sterile debridement of lesion accomplished no iatrogenic bleeding and will be seen back but she change color or grow in size or become painful.  I did not see any pathology associated with it and we will not biopsy due to the fact she has a number of lesions that have been present for a number of years and they are all the same type of tissue

## 2020-04-18 ENCOUNTER — Ambulatory Visit (HOSPITAL_COMMUNITY)
Admission: RE | Admit: 2020-04-18 | Discharge: 2020-04-18 | Disposition: A | Discharge status: Home / Self Care | Payer: BC Managed Care – PPO | Source: Ambulatory Visit | Attending: Family Medicine | Admitting: Family Medicine

## 2020-04-18 ENCOUNTER — Other Ambulatory Visit: Payer: Self-pay

## 2020-04-18 DIAGNOSIS — Z1231 Encounter for screening mammogram for malignant neoplasm of breast: Secondary | ICD-10-CM

## 2020-04-19 ENCOUNTER — Ambulatory Visit (HOSPITAL_COMMUNITY): Payer: BC Managed Care – PPO

## 2021-03-12 ENCOUNTER — Other Ambulatory Visit (HOSPITAL_COMMUNITY): Payer: Self-pay | Admitting: Family Medicine

## 2021-03-12 DIAGNOSIS — Z1231 Encounter for screening mammogram for malignant neoplasm of breast: Secondary | ICD-10-CM

## 2021-03-13 ENCOUNTER — Other Ambulatory Visit (HOSPITAL_COMMUNITY): Payer: Self-pay | Admitting: Family Medicine

## 2021-03-13 DIAGNOSIS — N958 Other specified menopausal and perimenopausal disorders: Secondary | ICD-10-CM

## 2021-04-26 ENCOUNTER — Ambulatory Visit (HOSPITAL_COMMUNITY): Payer: BC Managed Care – PPO

## 2021-04-29 ENCOUNTER — Ambulatory Visit (HOSPITAL_COMMUNITY)
Admission: RE | Admit: 2021-04-29 | Discharge: 2021-04-29 | Disposition: A | Discharge status: Home / Self Care | Payer: Medicare Other | Source: Ambulatory Visit | Attending: Family Medicine | Admitting: Family Medicine

## 2021-04-29 ENCOUNTER — Other Ambulatory Visit: Payer: Self-pay

## 2021-04-29 DIAGNOSIS — Z1231 Encounter for screening mammogram for malignant neoplasm of breast: Secondary | ICD-10-CM

## 2021-04-29 DIAGNOSIS — N958 Other specified menopausal and perimenopausal disorders: Secondary | ICD-10-CM | POA: Diagnosis present

## 2022-03-27 ENCOUNTER — Other Ambulatory Visit (HOSPITAL_COMMUNITY): Payer: Self-pay | Admitting: Family Medicine

## 2022-03-27 DIAGNOSIS — Z1231 Encounter for screening mammogram for malignant neoplasm of breast: Secondary | ICD-10-CM

## 2022-05-02 ENCOUNTER — Ambulatory Visit (HOSPITAL_COMMUNITY)
Admission: RE | Admit: 2022-05-02 | Discharge: 2022-05-02 | Disposition: A | Discharge status: Home / Self Care | Payer: Medicare Other | Source: Ambulatory Visit | Attending: Family Medicine | Admitting: Family Medicine

## 2022-05-02 DIAGNOSIS — Z1231 Encounter for screening mammogram for malignant neoplasm of breast: Secondary | ICD-10-CM | POA: Diagnosis not present

## 2023-02-26 ENCOUNTER — Other Ambulatory Visit (HOSPITAL_COMMUNITY): Payer: Self-pay | Admitting: Physician Assistant

## 2023-02-26 DIAGNOSIS — Z1231 Encounter for screening mammogram for malignant neoplasm of breast: Secondary | ICD-10-CM

## 2023-02-27 ENCOUNTER — Telehealth (INDEPENDENT_AMBULATORY_CARE_PROVIDER_SITE_OTHER): Payer: Self-pay | Admitting: Gastroenterology

## 2023-02-27 NOTE — Telephone Encounter (Signed)
Pt left message wanting to schedule TCS.  Pt not due for TCS until 04/2028. Left message on pt voicemail.

## 2023-03-26 ENCOUNTER — Encounter (HOSPITAL_COMMUNITY): Payer: Self-pay | Admitting: Physician Assistant

## 2023-03-26 ENCOUNTER — Other Ambulatory Visit (HOSPITAL_COMMUNITY): Payer: Self-pay | Admitting: Physician Assistant

## 2023-03-26 DIAGNOSIS — E2839 Other primary ovarian failure: Secondary | ICD-10-CM

## 2023-05-06 ENCOUNTER — Ambulatory Visit (HOSPITAL_COMMUNITY)
Admission: RE | Admit: 2023-05-06 | Discharge: 2023-05-06 | Disposition: A | Discharge status: Home / Self Care | Payer: Medicare Other | Source: Ambulatory Visit | Attending: Physician Assistant | Admitting: Physician Assistant

## 2023-05-06 DIAGNOSIS — E2839 Other primary ovarian failure: Secondary | ICD-10-CM | POA: Insufficient documentation

## 2023-05-06 DIAGNOSIS — Z1231 Encounter for screening mammogram for malignant neoplasm of breast: Secondary | ICD-10-CM | POA: Insufficient documentation

## 2023-06-01 ENCOUNTER — Other Ambulatory Visit (HOSPITAL_COMMUNITY): Payer: Self-pay | Admitting: Nurse Practitioner

## 2023-06-01 DIAGNOSIS — R202 Paresthesia of skin: Secondary | ICD-10-CM

## 2023-06-01 DIAGNOSIS — M5416 Radiculopathy, lumbar region: Secondary | ICD-10-CM

## 2023-06-01 DIAGNOSIS — M533 Sacrococcygeal disorders, not elsewhere classified: Secondary | ICD-10-CM

## 2023-06-09 ENCOUNTER — Ambulatory Visit (HOSPITAL_COMMUNITY)
Admission: RE | Admit: 2023-06-09 | Discharge: 2023-06-09 | Disposition: A | Discharge status: Home / Self Care | Payer: Medicare Other | Source: Ambulatory Visit | Attending: Nurse Practitioner | Admitting: Nurse Practitioner

## 2023-06-09 DIAGNOSIS — M5416 Radiculopathy, lumbar region: Secondary | ICD-10-CM | POA: Diagnosis present

## 2023-06-09 DIAGNOSIS — M533 Sacrococcygeal disorders, not elsewhere classified: Secondary | ICD-10-CM | POA: Insufficient documentation

## 2023-06-09 DIAGNOSIS — R202 Paresthesia of skin: Secondary | ICD-10-CM | POA: Diagnosis present

## 2023-06-09 DIAGNOSIS — R200 Anesthesia of skin: Secondary | ICD-10-CM | POA: Diagnosis present

## 2023-08-05 ENCOUNTER — Other Ambulatory Visit: Payer: Self-pay | Admitting: Medical Genetics

## 2023-08-07 ENCOUNTER — Other Ambulatory Visit (HOSPITAL_COMMUNITY)
Admission: RE | Admit: 2023-08-07 | Discharge: 2023-08-07 | Disposition: A | Discharge status: Home / Self Care | Payer: Self-pay | Source: Ambulatory Visit | Attending: Medical Genetics | Admitting: Medical Genetics

## 2023-08-21 LAB — GENECONNECT MOLECULAR SCREEN: Genetic Analysis Overall Interpretation: NEGATIVE

## 2024-03-16 ENCOUNTER — Encounter (INDEPENDENT_AMBULATORY_CARE_PROVIDER_SITE_OTHER): Payer: Self-pay | Admitting: Gastroenterology

## 2024-03-17 ENCOUNTER — Other Ambulatory Visit (HOSPITAL_COMMUNITY): Payer: Self-pay | Admitting: Physician Assistant

## 2024-03-17 DIAGNOSIS — Z1231 Encounter for screening mammogram for malignant neoplasm of breast: Secondary | ICD-10-CM

## 2024-04-12 ENCOUNTER — Other Ambulatory Visit (HOSPITAL_COMMUNITY): Payer: Self-pay | Admitting: Physician Assistant

## 2024-04-12 DIAGNOSIS — E7800 Pure hypercholesterolemia, unspecified: Secondary | ICD-10-CM

## 2024-04-12 DIAGNOSIS — Z1231 Encounter for screening mammogram for malignant neoplasm of breast: Secondary | ICD-10-CM

## 2024-04-18 ENCOUNTER — Encounter (HOSPITAL_COMMUNITY): Payer: Self-pay

## 2024-04-18 ENCOUNTER — Ambulatory Visit (HOSPITAL_COMMUNITY): Payer: Self-pay

## 2024-05-06 ENCOUNTER — Inpatient Hospital Stay (HOSPITAL_COMMUNITY)
Admission: RE | Admit: 2024-05-06 | Discharge: 2024-05-06 | Payer: Self-pay | Attending: Physician Assistant | Admitting: Physician Assistant

## 2024-05-06 ENCOUNTER — Ambulatory Visit (HOSPITAL_COMMUNITY)
Admission: RE | Admit: 2024-05-06 | Discharge: 2024-05-06 | Disposition: A | Source: Ambulatory Visit | Attending: Physician Assistant | Admitting: Physician Assistant

## 2024-05-06 DIAGNOSIS — Z1231 Encounter for screening mammogram for malignant neoplasm of breast: Secondary | ICD-10-CM

## 2024-05-06 DIAGNOSIS — E7800 Pure hypercholesterolemia, unspecified: Secondary | ICD-10-CM

## 2024-05-09 ENCOUNTER — Ambulatory Visit (HOSPITAL_COMMUNITY)

## 2024-05-20 ENCOUNTER — Other Ambulatory Visit (HOSPITAL_COMMUNITY): Payer: Self-pay | Admitting: Cardiology

## 2024-05-20 ENCOUNTER — Encounter (HOSPITAL_COMMUNITY): Payer: Self-pay

## 2024-05-20 DIAGNOSIS — E7849 Other hyperlipidemia: Secondary | ICD-10-CM

## 2024-05-20 DIAGNOSIS — I10 Essential (primary) hypertension: Secondary | ICD-10-CM

## 2024-05-23 ENCOUNTER — Other Ambulatory Visit: Payer: Self-pay | Admitting: Cardiology

## 2024-05-23 ENCOUNTER — Ambulatory Visit
Admission: RE | Admit: 2024-05-23 | Discharge: 2024-05-23 | Disposition: A | Discharge status: Home / Self Care | Source: Ambulatory Visit | Attending: Cardiology | Admitting: Cardiology

## 2024-05-23 ENCOUNTER — Ambulatory Visit
Admission: RE | Admit: 2024-05-23 | Discharge: 2024-05-23 | Disposition: A | Source: Ambulatory Visit | Attending: Cardiology

## 2024-05-23 DIAGNOSIS — E7849 Other hyperlipidemia: Secondary | ICD-10-CM | POA: Diagnosis present

## 2024-05-23 DIAGNOSIS — R931 Abnormal findings on diagnostic imaging of heart and coronary circulation: Secondary | ICD-10-CM

## 2024-05-23 DIAGNOSIS — I2510 Atherosclerotic heart disease of native coronary artery without angina pectoris: Secondary | ICD-10-CM | POA: Diagnosis not present

## 2024-05-23 DIAGNOSIS — I2582 Chronic total occlusion of coronary artery: Secondary | ICD-10-CM | POA: Insufficient documentation

## 2024-05-23 DIAGNOSIS — R9431 Abnormal electrocardiogram [ECG] [EKG]: Secondary | ICD-10-CM | POA: Insufficient documentation

## 2024-05-23 DIAGNOSIS — I10 Essential (primary) hypertension: Secondary | ICD-10-CM | POA: Insufficient documentation

## 2024-05-23 MED ORDER — METOPROLOL TARTRATE 5 MG/5ML IV SOLN
10.0000 mg | INTRAVENOUS | Status: DC | PRN
  Administered 2024-05-23: 10 mg via INTRAVENOUS
  Filled 2024-05-23 (×2): qty 10

## 2024-05-23 MED ORDER — METOPROLOL TARTRATE 5 MG/5ML IV SOLN
INTRAVENOUS | Status: AC
  Filled 2024-05-23: qty 10

## 2024-05-23 MED ORDER — NITROGLYCERIN 0.4 MG SL SUBL
0.8000 mg | SUBLINGUAL_TABLET | Freq: Once | SUBLINGUAL | Status: AC
  Administered 2024-05-23: 0.8 mg via SUBLINGUAL
  Filled 2024-05-23: qty 25

## 2024-05-23 MED ORDER — DILTIAZEM HCL 25 MG/5ML IV SOLN
10.0000 mg | INTRAVENOUS | Status: DC | PRN
  Filled 2024-05-23: qty 5

## 2024-05-23 MED ORDER — IOHEXOL 350 MG/ML SOLN
100.0000 mL | Freq: Once | INTRAVENOUS | Status: AC | PRN
  Administered 2024-05-23: 100 mL via INTRAVENOUS

## 2024-06-29 NOTE — Telephone Encounter (Signed)
 Noted.

## 2024-08-09 ENCOUNTER — Ambulatory Visit
